# Patient Record
Sex: Male | Born: 1990 | Race: White | Hispanic: No | Marital: Married | Smoking: Never smoker
Health system: Southern US, Community
[De-identification: ages and names within clinical notes are randomized; demographics above are authoritative.]

## PROBLEM LIST (undated history)

## (undated) DIAGNOSIS — S02609A Fracture of mandible, unspecified, initial encounter for closed fracture: Secondary | ICD-10-CM

## (undated) DIAGNOSIS — Z87820 Personal history of traumatic brain injury: Secondary | ICD-10-CM

## (undated) DIAGNOSIS — J45909 Unspecified asthma, uncomplicated: Secondary | ICD-10-CM

## (undated) DIAGNOSIS — J302 Other seasonal allergic rhinitis: Secondary | ICD-10-CM

## (undated) DIAGNOSIS — S129XXA Fracture of neck, unspecified, initial encounter: Secondary | ICD-10-CM

## (undated) HISTORY — PX: FRACTURE SURGERY: SHX138

---

## 2010-08-25 ENCOUNTER — Emergency Department (HOSPITAL_COMMUNITY)
Admission: EM | Admit: 2010-08-25 | Discharge: 2010-08-26 | Payer: Self-pay | Source: Home / Self Care | Admitting: Emergency Medicine

## 2010-09-01 LAB — COMPREHENSIVE METABOLIC PANEL
ALT: 53 U/L (ref 0–53)
AST: 41 U/L — ABNORMAL HIGH (ref 0–37)
Albumin: 4.5 g/dL (ref 3.5–5.2)
Alkaline Phosphatase: 56 U/L (ref 39–117)
BUN: 13 mg/dL (ref 6–23)
CO2: 26 mEq/L (ref 19–32)
Calcium: 9.7 mg/dL (ref 8.4–10.5)
Chloride: 106 mEq/L (ref 96–112)
Creatinine, Ser: 0.94 mg/dL (ref 0.4–1.5)
GFR calc Af Amer: >60 mL/min (ref >60)
GFR calc non Af Amer: >60 mL/min (ref >60)
Glucose, Bld: 87 mg/dL (ref 70–99)
Potassium: 3.9 mEq/L (ref 3.5–5.1)
Sodium: 140 mEq/L (ref 135–145)
Total Bilirubin: 0.9 mg/dL (ref 0.3–1.2)
Total Protein: 7.3 g/dL (ref 6.0–8.3)

## 2010-09-01 LAB — URINALYSIS, ROUTINE W REFLEX MICROSCOPIC
Bilirubin Urine: NEGATIVE
Hgb urine dipstick: NEGATIVE
Ketones, ur: NEGATIVE mg/dL
Nitrite: NEGATIVE
Protein, ur: NEGATIVE mg/dL
Specific Gravity, Urine: 1.01 (ref 1.005–1.030)
Urine Glucose, Fasting: NEGATIVE mg/dL
Urobilinogen, UA: 0.2 mg/dL (ref 0.0–1.0)
pH: 7 (ref 5.0–8.0)

## 2010-09-01 LAB — DIFFERENTIAL
Basophils Absolute: 0 10*3/uL (ref 0.0–0.1)
Basophils Relative: 0 % (ref 0–1)
Eosinophils Absolute: 0.3 10*3/uL (ref 0.0–0.7)
Eosinophils Relative: 3 % (ref 0–5)
Lymphocytes Relative: 18 % (ref 12–46)
Lymphs Abs: 1.5 10*3/uL (ref 0.7–4.0)
Monocytes Absolute: 0.7 10*3/uL (ref 0.1–1.0)
Monocytes Relative: 8 % (ref 3–12)
Neutro Abs: 5.8 10*3/uL (ref 1.7–7.7)
Neutrophils Relative %: 71 % (ref 43–77)

## 2010-09-01 LAB — CBC
HCT: 40.2 % (ref 39.0–52.0)
Hemoglobin: 14.5 g/dL (ref 13.0–17.0)
MCH: 31.2 pg (ref 26.0–34.0)
MCHC: 36.1 g/dL — ABNORMAL HIGH (ref 30.0–36.0)
MCV: 86.5 fL (ref 78.0–100.0)
Platelets: 157 10*3/uL (ref 150–400)
RBC: 4.65 MIL/uL (ref 4.22–5.81)
RDW: 12 % (ref 11.5–15.5)
WBC: 8.2 10*3/uL (ref 4.0–10.5)

## 2010-09-01 LAB — LIPASE, BLOOD: Lipase: 29 U/L (ref 11–59)

## 2010-10-10 ENCOUNTER — Other Ambulatory Visit (HOSPITAL_COMMUNITY): Payer: Self-pay | Admitting: Family Medicine

## 2010-10-10 DIAGNOSIS — N5089 Other specified disorders of the male genital organs: Secondary | ICD-10-CM

## 2010-10-13 ENCOUNTER — Ambulatory Visit (HOSPITAL_COMMUNITY)
Admission: RE | Admit: 2010-10-13 | Discharge: 2010-10-13 | Disposition: A | Discharge status: Home / Self Care | Payer: Medicaid Other | Source: Ambulatory Visit | Attending: Family Medicine | Admitting: Family Medicine

## 2010-10-13 ENCOUNTER — Other Ambulatory Visit (HOSPITAL_COMMUNITY): Payer: Self-pay | Admitting: Family Medicine

## 2010-10-13 DIAGNOSIS — N508 Other specified disorders of male genital organs: Secondary | ICD-10-CM | POA: Insufficient documentation

## 2010-10-13 DIAGNOSIS — N5089 Other specified disorders of the male genital organs: Secondary | ICD-10-CM

## 2010-11-26 ENCOUNTER — Emergency Department (HOSPITAL_COMMUNITY)
Admission: EM | Admit: 2010-11-26 | Discharge: 2010-11-26 | Disposition: A | Discharge status: Home / Self Care | Payer: Medicaid Other | Attending: Emergency Medicine | Admitting: Emergency Medicine

## 2010-11-26 ENCOUNTER — Emergency Department (HOSPITAL_COMMUNITY): Payer: Medicaid Other

## 2010-11-26 DIAGNOSIS — J45909 Unspecified asthma, uncomplicated: Secondary | ICD-10-CM | POA: Insufficient documentation

## 2010-11-26 DIAGNOSIS — J309 Allergic rhinitis, unspecified: Secondary | ICD-10-CM | POA: Insufficient documentation

## 2012-07-08 ENCOUNTER — Emergency Department (HOSPITAL_COMMUNITY): Payer: Medicaid Other

## 2012-07-08 ENCOUNTER — Encounter (HOSPITAL_COMMUNITY): Payer: Self-pay | Admitting: *Deleted

## 2012-07-08 ENCOUNTER — Emergency Department (HOSPITAL_COMMUNITY)
Admission: EM | Admit: 2012-07-08 | Discharge: 2012-07-09 | Disposition: A | Discharge status: Home / Self Care | Payer: Medicaid Other | Attending: Emergency Medicine | Admitting: Emergency Medicine

## 2012-07-08 DIAGNOSIS — Y92009 Unspecified place in unspecified non-institutional (private) residence as the place of occurrence of the external cause: Secondary | ICD-10-CM | POA: Insufficient documentation

## 2012-07-08 DIAGNOSIS — Z23 Encounter for immunization: Secondary | ICD-10-CM | POA: Insufficient documentation

## 2012-07-08 DIAGNOSIS — S61409A Unspecified open wound of unspecified hand, initial encounter: Secondary | ICD-10-CM | POA: Insufficient documentation

## 2012-07-08 DIAGNOSIS — S61459A Open bite of unspecified hand, initial encounter: Secondary | ICD-10-CM

## 2012-07-08 DIAGNOSIS — Y939 Activity, unspecified: Secondary | ICD-10-CM | POA: Insufficient documentation

## 2012-07-08 DIAGNOSIS — W540XXA Bitten by dog, initial encounter: Secondary | ICD-10-CM | POA: Insufficient documentation

## 2012-07-08 MED ORDER — AMOXICILLIN-POT CLAVULANATE 875-125 MG PO TABS
1.0000 | ORAL_TABLET | Freq: Once | ORAL | Status: AC
  Administered 2012-07-08: 1 via ORAL
  Filled 2012-07-08: qty 1

## 2012-07-08 MED ORDER — RABIES IMMUNE GLOBULIN 150 UNIT/ML IM INJ
INJECTION | INTRAMUSCULAR | Status: AC
  Filled 2012-07-08: qty 2

## 2012-07-08 MED ORDER — AMOXICILLIN-POT CLAVULANATE 875-125 MG PO TABS: 1.0000 | ORAL_TABLET | Freq: Two times a day (BID) | ORAL | Status: DC

## 2012-07-08 MED ORDER — RABIES VACCINE, PCEC IM SUSR
1.0000 mL | Freq: Once | INTRAMUSCULAR | Status: AC
  Administered 2012-07-08: 1 mL via INTRAMUSCULAR
  Filled 2012-07-08 (×2): qty 1

## 2012-07-08 MED ORDER — RABIES IMMUNE GLOBULIN 150 UNIT/ML IM INJ
INJECTION | INTRAMUSCULAR | Status: AC
  Filled 2012-07-08: qty 12

## 2012-07-08 MED ORDER — NAPROXEN 500 MG PO TABS: 500.0000 mg | ORAL_TABLET | Freq: Two times a day (BID) | ORAL | Status: DC

## 2012-07-08 MED ORDER — RABIES IMMUNE GLOBULIN 150 UNIT/ML IM INJ
20.0000 [IU]/kg | INJECTION | Freq: Once | INTRAMUSCULAR | Status: AC
  Administered 2012-07-08: 2100 [IU] via INTRAMUSCULAR
  Filled 2012-07-08: qty 14

## 2012-07-08 NOTE — ED Notes (Signed)
[facility] animal control called back and spoke with patient.

## 2012-07-08 NOTE — ED Provider Notes (Signed)
History     CSN: [id number]  Arrival date & time 07/08/12  1949   First MD Initiated Contact with Patient 07/08/12 2050      Chief Complaint  Patient presents with  . Animal Bite    (Consider location/radiation/quality/duration/timing/severity/associated sxs/prior treatment) The history is provided by the patient and the spouse.  21 year old male status post dog bite to left hand around the thumb and thenar eminence. That occurred yesterday. The dog has gotten away and his whereabouts is unknown. [employer] and to a friend of his. Immunizations not verified. No other injuries.  Patient states there is swelling to the hand in some discomfort but it's minimal maybe 4/10 or 3/10.  History reviewed. No pertinent past medical history.  History reviewed. No pertinent past surgical history.  History reviewed. No pertinent family history.  History  Substance Use Topics  . Smoking status: Never Smoker   . Smokeless tobacco: Not on file  . Alcohol Use: No      Review of Systems  Constitutional: Negative for fever.  HENT: Negative for neck pain.   Eyes: Negative for redness.  Respiratory: Negative for shortness of breath.   Cardiovascular: Negative for chest pain.  Gastrointestinal: Negative for abdominal pain.  Genitourinary: Negative for hematuria.  Musculoskeletal: Negative for back pain.  Skin: Positive for wound. Negative for rash.  Neurological: Negative for weakness, numbness and headaches.  Hematological: Does not bruise/bleed easily.    Allergies  Review of patient's allergies indicates no known allergies.  Home Medications   Current Outpatient Rx  Name  Route  Sig  Dispense  Refill  . IBUPROFEN 200 MG PO TABS   Oral   Take 600 mg by mouth 2 (two) times daily as needed. For pain         . AMOXICILLIN-POT CLAVULANATE 875-125 MG PO TABS   Oral   Take 1 tablet by mouth every 12 (twelve) hours.   14 tablet   0   . NAPROXEN 500 MG PO TABS   Oral   Take  1 tablet (500 mg total) by mouth 2 (two) times daily.   14 tablet   0     BP 112/66  Pulse 79  Temp 98.7 F (37.1 C) (Oral)  Resp 20  Ht 6' (1.829 m)  Wt 233 lb (105.688 kg)  BMI 31.60 kg/m2  SpO2 98%  Physical Exam  Nursing note and vitals reviewed. Constitutional: He is oriented to person, place, and time. He appears well-developed and well-nourished. No distress.  HENT:  Head: Normocephalic and atraumatic.  Mouth/Throat: Oropharynx is clear and moist.  Eyes: Conjunctivae normal and EOM are normal. Pupils are equal, round, and reactive to light.  Neck: Normal range of motion. Neck supple.  Cardiovascular: Normal rate, regular rhythm, normal heart sounds and intact distal pulses.   No murmur heard. Pulmonary/Chest: Effort normal and breath sounds normal.  Abdominal: Soft. Bowel sounds are normal. There is no tenderness.  Musculoskeletal: Normal range of motion. He exhibits edema and tenderness.        With anterior and posterior puncture wounds around the thenar eminence in the thumb itself. Some swelling to the thenar eminence good range of motion of thumb and index finger no sniffing and swelling of the index finger slight swelling of the thumb. Cap refill less than 2 seconds sensation intact. No evidence of deep infection. No erythema. No skin tears or lacerations requiring repair. Just puncture wounds.  Neurological: He is alert and oriented to person,  place, and time. No cranial nerve deficit. He exhibits normal muscle tone. Coordination normal.  Skin: Skin is warm. No rash noted.    ED Course  Procedures (including critical care time)  Labs Reviewed - No data to display Dg Hand Complete Left  07/08/2012  *RADIOLOGY REPORT*  Clinical Data: Puncture wound as post body to.  LEFT HAND - COMPLETE 3+ VIEW  Comparison: None.  Findings: No radiodense foreign body.  No subcutaneous gas is evident. Negative for fracture, dislocation, or other acute abnormality.  Normal alignment  and mineralization. No significant degenerative change.  Regional soft tissues unremarkable.  IMPRESSION:  Negative   Original Report Authenticated By: D. Andria Rhein, MD      1. Animal bite of hand       MDM  Patient status post bite from a dog to his left hand in the thenar eminence thumb area yesterday. Daughter's whereabouts is unknown. Dogs immunization records are unknown previous owner thought that the dog had his rabies vaccines. As stated the dog is missing. X-rays of the left hand show no bony injury. No evidence of frank infection today. We'll treat with Augmentin to help prevent infection. Rabies immunoglobulin and rabies vaccination protocol started and initiated today.        Shelda Jakes, MD 07/08/12 [phone]

## 2012-07-08 NOTE — ED Notes (Addendum)
Dog bite to lt hand yesterday. Does not  Know about vaccinations , had gotten the dog from someone else for hunting, Does not know where the dog is .  Bite occurred at pts home in [facility]. And dog ran off.  Pt has not talked with animal control

## 2012-07-08 NOTE — ED Notes (Signed)
[facility] contacted about dog bite and advised they would have animal control contact the ED.

## 2012-07-11 ENCOUNTER — Encounter (HOSPITAL_COMMUNITY): Payer: Medicaid Other | Attending: Emergency Medicine

## 2012-07-11 ENCOUNTER — Emergency Department (HOSPITAL_COMMUNITY)
Admission: EM | Admit: 2012-07-11 | Discharge: 2012-07-11 | Discharge status: Against Medical Advice | Payer: Medicaid Other

## 2012-07-11 DIAGNOSIS — T148XXA Other injury of unspecified body region, initial encounter: Secondary | ICD-10-CM | POA: Insufficient documentation

## 2012-07-11 DIAGNOSIS — Z23 Encounter for immunization: Secondary | ICD-10-CM | POA: Insufficient documentation

## 2012-07-11 DIAGNOSIS — W540XXA Bitten by dog, initial encounter: Secondary | ICD-10-CM | POA: Insufficient documentation

## 2012-07-11 NOTE — ED Notes (Signed)
Day 3 Rabies HDCV given 0810 right deltoid. Lot # [id number], Exp 04/26/13. Paperwork faxed to specialty clinic and pharmacy.

## 2012-07-15 ENCOUNTER — Encounter (HOSPITAL_COMMUNITY): Payer: Medicaid Other

## 2012-07-15 DIAGNOSIS — W540XXA Bitten by dog, initial encounter: Secondary | ICD-10-CM

## 2012-07-15 MED ORDER — RABIES VACCINE, PCEC IM SUSR
1.0000 mL | Freq: Once | INTRAMUSCULAR | Status: AC
  Administered 2012-07-15: 1 mL via INTRAMUSCULAR
  Filled 2012-07-15: qty 1

## 2012-07-15 NOTE — Progress Notes (Signed)
Patrick Sutton presents today for injection per MD orders. Rabies vaccine administered SQ in right Upper Arm. Administration without incident. Patient tolerated well.

## 2012-07-22 ENCOUNTER — Encounter (HOSPITAL_COMMUNITY): Payer: Medicaid Other | Attending: Emergency Medicine

## 2012-07-22 VITALS — BP 119/80 | HR 78 | Temp 97.8°F | Resp 20

## 2012-07-22 DIAGNOSIS — W540XXA Bitten by dog, initial encounter: Secondary | ICD-10-CM | POA: Insufficient documentation

## 2012-07-22 DIAGNOSIS — T148XXA Other injury of unspecified body region, initial encounter: Secondary | ICD-10-CM | POA: Insufficient documentation

## 2012-07-22 MED ORDER — RABIES VACCINE, PCEC IM SUSR
1.0000 mL | Freq: Once | INTRAMUSCULAR | Status: AC
  Administered 2012-07-22: 1 mL via INTRAMUSCULAR

## 2012-07-22 NOTE — Progress Notes (Signed)
Patrick Sutton presents today for injection per MD orders. Rabies vaccine 1 ml administered im in right Upper Arm. Administration without incident. Patient tolerated well.

## 2013-09-04 ENCOUNTER — Emergency Department (HOSPITAL_COMMUNITY)
Admission: EM | Admit: 2013-09-04 | Discharge: 2013-09-04 | Disposition: A | Discharge status: Home / Self Care | Payer: Medicaid Other | Attending: Emergency Medicine | Admitting: Emergency Medicine

## 2013-09-04 ENCOUNTER — Emergency Department (HOSPITAL_COMMUNITY): Payer: Medicaid Other

## 2013-09-04 ENCOUNTER — Encounter (HOSPITAL_COMMUNITY): Payer: Self-pay | Admitting: Emergency Medicine

## 2013-09-04 DIAGNOSIS — Z791 Long term (current) use of non-steroidal anti-inflammatories (NSAID): Secondary | ICD-10-CM | POA: Insufficient documentation

## 2013-09-04 DIAGNOSIS — X58XXXA Exposure to other specified factors, initial encounter: Secondary | ICD-10-CM | POA: Insufficient documentation

## 2013-09-04 DIAGNOSIS — Y929 Unspecified place or not applicable: Secondary | ICD-10-CM | POA: Insufficient documentation

## 2013-09-04 DIAGNOSIS — G8929 Other chronic pain: Secondary | ICD-10-CM | POA: Insufficient documentation

## 2013-09-04 DIAGNOSIS — S93402A Sprain of unspecified ligament of left ankle, initial encounter: Secondary | ICD-10-CM

## 2013-09-04 DIAGNOSIS — Y939 Activity, unspecified: Secondary | ICD-10-CM | POA: Insufficient documentation

## 2013-09-04 DIAGNOSIS — S93409A Sprain of unspecified ligament of unspecified ankle, initial encounter: Secondary | ICD-10-CM | POA: Insufficient documentation

## 2013-09-04 DIAGNOSIS — Z792 Long term (current) use of antibiotics: Secondary | ICD-10-CM | POA: Insufficient documentation

## 2013-09-04 MED ORDER — CELECOXIB 100 MG PO CAPS: 100.0000 mg | ORAL_CAPSULE | Freq: Two times a day (BID) | ORAL | Status: DC

## 2013-09-04 NOTE — ED Notes (Signed)
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

## 2013-09-04 NOTE — ED Provider Notes (Signed)
Medical screening examination/treatment/procedure(s) were performed by non-physician practitioner and as supervising physician I was immediately available for consultation/collaboration.  EKG Interpretation   None         Zniya Cottone L Mairany Bruno, MD 09/04/13 2332 

## 2013-09-04 NOTE — ED Notes (Signed)
Pain lt ankle  For 1 week, good dp pulse and distal sensation.  ASO applied.

## 2013-09-04 NOTE — Discharge Instructions (Signed)
Your x-ray is negative for fracture or dislocation. Your examination is consistent with possible ankle sprain. There is some concern of possible week tendon of the lateral portion of your left foot. Please use the ankle stirrup splint over the next 7-10 days. Please use Celebrex 2 times daily with food. Please see the orthopedic physician listed above for additional management if not improving. Ankle Sprain An ankle sprain is an injury to the strong, fibrous tissues (ligaments) that hold your ankle bones together.  HOME CARE   Put ice on your ankle for 1 2 days or as told by your doctor.  Put ice in a plastic bag.  Place a towel between your skin and the bag.  Leave the ice on for 15-20 minutes at a time, every 2 hours while you are awake.  Only take medicine as told by your doctor.  Raise (elevate) your injured ankle above the level of your heart as much as possible for 2 3 days.  Use crutches if your doctor tells you to. Slowly put your own weight on the affected ankle. Use the crutches until you can walk without pain.  If you have a plaster splint:  Do not rest it on anything harder than a pillow for 24 hours.  Do not put weight on it.  Do not get it wet.  Take it off to shower or bathe.  If given, use an elastic wrap or support stocking for support. Take the wrap off if your toes lose feeling (numb), tingle, or turn cold or blue.  If you have an air splint:  Add or let out air to make it comfortable.  Take it off at night and to shower and bathe.  Wiggle your toes and move your ankle up and down often while you are wearing it. GET HELP RIGHT AWAY IF:   Your toes lose feeling (numb) or turn blue.  You have severe pain that is increasing.  You have rapidly increasing bruising or puffiness (swelling).  Your toes feel very cold.  You lose feeling in your foot.  Your medicine does not help your pain. MAKE SURE YOU:   Understand these instructions.  Will watch  your condition.  Will get help right away if you are not doing well or get worse. Document Released: 01/20/2008 Document Revised: 04/27/2012 Document Reviewed: 02/15/2012 [facility] Patient Information 2014 [employer], [place].

## 2013-09-04 NOTE — ED Provider Notes (Signed)
CSN: [id number]     Arrival date & time 09/04/13  1521 History   First MD Initiated Contact with Patient 09/04/13 1859     Chief Complaint  Patient presents with  . Ankle Pain   (Consider location/radiation/quality/duration/timing/severity/associated sxs/prior Treatment) Patient is a 23 y.o. male presenting with ankle pain. The history is provided by the patient.  Ankle Pain Location:  Ankle Time since incident:  1 week Ankle location:  L ankle Pain details:    Quality:  Aching   Radiates to:  Does not radiate   Severity:  Moderate   Duration:  1 week   Timing:  Intermittent   Progression:  Worsening Chronicity: acute on chronic. Dislocation: no   Prior injury to area:  Yes Relieved by:  Nothing Worsened by:  Bearing weight Ineffective treatments:  None tried Risk factors: no frequent fractures     Past Medical History  Diagnosis Date  . Chronic ankle pain    History reviewed. No pertinent past surgical history. No family history on file. History  Substance Use Topics  . Smoking status: Never Smoker   . Smokeless tobacco: Not on file  . Alcohol Use: No    Review of Systems  Allergies  Review of patient's allergies indicates no known allergies.  Home Medications   Current Outpatient Rx  Name  Route  Sig  Dispense  Refill  . amoxicillin-clavulanate (AUGMENTIN) 875-125 MG per tablet   Oral   Take 1 tablet by mouth every 12 (twelve) hours.   14 tablet   0   . ibuprofen (ADVIL,MOTRIN) 200 MG tablet   Oral   Take 600 mg by mouth 2 (two) times daily as needed. For pain         . naproxen (NAPROSYN) 500 MG tablet   Oral   Take 1 tablet (500 mg total) by mouth 2 (two) times daily.   14 tablet   0    BP 124/63  Pulse 79  Temp(Src) 98.4 F (36.9 C) (Oral)  Resp 24  Ht 6\' 1"  (1.854 m)  Wt 250 lb (113.399 kg)  BMI 32.99 kg/m2  SpO2 99% Physical Exam  Nursing note and vitals reviewed. Constitutional: He is oriented to person, place, and time. He  appears well-developed and well-nourished.  Non-toxic appearance.  HENT:  Head: Normocephalic.  Right Ear: Tympanic membrane and external ear normal.  Left Ear: Tympanic membrane and external ear normal.  Eyes: EOM and lids are normal. Pupils are equal, round, and reactive to light.  Neck: Normal range of motion. Neck supple. Carotid bruit is not present.  Cardiovascular: Normal rate, regular rhythm, normal heart sounds, intact distal pulses and normal pulses.   Pulmonary/Chest: Breath sounds normal. No respiratory distress.  Abdominal: Soft. Bowel sounds are normal. There is no tenderness. There is no guarding.  Musculoskeletal: Normal range of motion.  There is pain and mild swelling of the lateral malleolus. The Achilles tendon is intact on the left ankle. The dorsalis pedis pulse is 2+ on the left. The capillary refill is less than 2 seconds on the left.  Lymphadenopathy:       Head (right side): No submandibular adenopathy present.       Head (left side): No submandibular adenopathy present.    He has no cervical adenopathy.  Neurological: He is alert and oriented to person, place, and time. He has normal strength. No cranial nerve deficit or sensory deficit.  Skin: Skin is warm and dry.  Psychiatric: He has a  normal mood and affect. His speech is normal.    ED Course  Procedures (including critical care time) Labs Review Labs Reviewed - No data to display Imaging Review Dg Ankle Complete Left  09/04/2013   CLINICAL DATA:  Injury  EXAM: LEFT ANKLE COMPLETE - 3+ VIEW  COMPARISON:  None.  FINDINGS: There is no evidence of fracture, dislocation, or joint effusion. Ankle mortise is approximated. There is no evidence of arthropathy or other focal bone abnormality. Mild soft tissue swelling is present.  IMPRESSION: Mild diffuse soft tissue swelling. No acute fracture or dislocation.   Electronically Signed   By: Rise MuBenjamin  McClintock M.D.   On: 09/04/2013 16:19    EKG Interpretation    None       MDM  No diagnosis found. *I have reviewed nursing notes, vital signs, and all appropriate lab and imaging results for this patient.**  X-ray of the left ankle is negative for fracture or dislocation. There is mild diffuse soft tissue swelling present. Suspect that the patient has an ankle sprain of what was probably already week ankle from the injury 3 years ago. The plan at this time is for the patient being fitted with an ankle stirrup splint. He will be advised to use celebrex 2 times daily with food. The patient will be referred to orthopedics for additional evaluation if not improving.  Kathie DikeHobson M Amadeo Coke, PA-C 09/04/13 1920  Kathie DikeHobson M Leon Goodnow, PA-C 09/04/13 1925

## 2013-09-05 ENCOUNTER — Telehealth (HOSPITAL_COMMUNITY): Payer: Self-pay

## 2013-09-05 NOTE — ED Notes (Signed)
Call from pharmacy pt Rx for Celebrex not covered by Medicaid.  Chart to [facility] for review.

## 2013-09-06 ENCOUNTER — Telehealth (HOSPITAL_COMMUNITY): Payer: Self-pay

## 2013-09-06 NOTE — ED Notes (Signed)
Chart reviewed by Dr Wilkie AyeHorton "None, take home ibuprofen and naproxen as noted in chart"  Called pharmacy and informed .  Per pharmacist they spoke to prescribing provider and he changed Rx to Naproxen 500 mg tab.

## 2014-12-07 ENCOUNTER — Emergency Department (HOSPITAL_COMMUNITY): Payer: Medicaid Other

## 2014-12-07 ENCOUNTER — Encounter (HOSPITAL_COMMUNITY): Payer: Self-pay | Admitting: Emergency Medicine

## 2014-12-07 ENCOUNTER — Inpatient Hospital Stay (HOSPITAL_COMMUNITY)
Admission: EM | Admit: 2014-12-07 | Discharge: 2014-12-11 | DRG: 131 | Disposition: A | Discharge status: Home Health | Payer: Medicaid Other | Attending: General Surgery | Admitting: General Surgery

## 2014-12-07 DIAGNOSIS — S27329A Contusion of lung, unspecified, initial encounter: Secondary | ICD-10-CM

## 2014-12-07 DIAGNOSIS — S2239XA Fracture of one rib, unspecified side, initial encounter for closed fracture: Secondary | ICD-10-CM

## 2014-12-07 DIAGNOSIS — S0265XA Fracture of angle of mandible, initial encounter for closed fracture: Principal | ICD-10-CM | POA: Diagnosis present

## 2014-12-07 DIAGNOSIS — S02600A Fracture of unspecified part of body of mandible, initial encounter for closed fracture: Secondary | ICD-10-CM | POA: Diagnosis not present

## 2014-12-07 DIAGNOSIS — S8011XA Contusion of right lower leg, initial encounter: Secondary | ICD-10-CM

## 2014-12-07 DIAGNOSIS — S129XXA Fracture of neck, unspecified, initial encounter: Secondary | ICD-10-CM

## 2014-12-07 DIAGNOSIS — S0081XA Abrasion of other part of head, initial encounter: Secondary | ICD-10-CM

## 2014-12-07 DIAGNOSIS — S02609A Fracture of mandible, unspecified, initial encounter for closed fracture: Secondary | ICD-10-CM

## 2014-12-07 DIAGNOSIS — S060X9A Concussion with loss of consciousness of unspecified duration, initial encounter: Secondary | ICD-10-CM | POA: Diagnosis present

## 2014-12-07 DIAGNOSIS — S02401A Maxillary fracture, unspecified, initial encounter for closed fracture: Secondary | ICD-10-CM | POA: Diagnosis present

## 2014-12-07 DIAGNOSIS — S12391A Other nondisplaced fracture of fourth cervical vertebra, initial encounter for closed fracture: Secondary | ICD-10-CM | POA: Diagnosis present

## 2014-12-07 DIAGNOSIS — S27322A Contusion of lung, bilateral, initial encounter: Secondary | ICD-10-CM | POA: Diagnosis present

## 2014-12-07 DIAGNOSIS — S0101XA Laceration without foreign body of scalp, initial encounter: Secondary | ICD-10-CM | POA: Diagnosis present

## 2014-12-07 DIAGNOSIS — M25562 Pain in left knee: Secondary | ICD-10-CM

## 2014-12-07 DIAGNOSIS — Y92410 Unspecified street and highway as the place of occurrence of the external cause: Secondary | ICD-10-CM | POA: Diagnosis not present

## 2014-12-07 DIAGNOSIS — S12290A Other displaced fracture of third cervical vertebra, initial encounter for closed fracture: Secondary | ICD-10-CM

## 2014-12-07 DIAGNOSIS — S12200A Unspecified displaced fracture of third cervical vertebra, initial encounter for closed fracture: Secondary | ICD-10-CM | POA: Diagnosis present

## 2014-12-07 DIAGNOSIS — S12291A Other nondisplaced fracture of third cervical vertebra, initial encounter for closed fracture: Secondary | ICD-10-CM | POA: Diagnosis present

## 2014-12-07 DIAGNOSIS — S21301A Unspecified open wound of right front wall of thorax with penetration into thoracic cavity, initial encounter: Secondary | ICD-10-CM

## 2014-12-07 DIAGNOSIS — S060XAA Concussion with loss of consciousness status unknown, initial encounter: Secondary | ICD-10-CM | POA: Diagnosis present

## 2014-12-07 DIAGNOSIS — S2232XA Fracture of one rib, left side, initial encounter for closed fracture: Secondary | ICD-10-CM | POA: Diagnosis not present

## 2014-12-07 DIAGNOSIS — T1490XA Injury, unspecified, initial encounter: Secondary | ICD-10-CM

## 2014-12-07 DIAGNOSIS — Z87820 Personal history of traumatic brain injury: Secondary | ICD-10-CM

## 2014-12-07 DIAGNOSIS — S80811A Abrasion, right lower leg, initial encounter: Secondary | ICD-10-CM

## 2014-12-07 DIAGNOSIS — S0990XA Unspecified injury of head, initial encounter: Secondary | ICD-10-CM

## 2014-12-07 DIAGNOSIS — S12300A Unspecified displaced fracture of fourth cervical vertebra, initial encounter for closed fracture: Secondary | ICD-10-CM | POA: Diagnosis present

## 2014-12-07 DIAGNOSIS — M542 Cervicalgia: Secondary | ICD-10-CM

## 2014-12-07 DIAGNOSIS — S0993XA Unspecified injury of face, initial encounter: Secondary | ICD-10-CM | POA: Diagnosis present

## 2014-12-07 HISTORY — DX: Personal history of traumatic brain injury: Z87.820

## 2014-12-07 HISTORY — DX: Fracture of neck, unspecified, initial encounter: S12.9XXA

## 2014-12-07 HISTORY — DX: Unspecified asthma, uncomplicated: J45.909

## 2014-12-07 HISTORY — DX: Fracture of mandible, unspecified, initial encounter for closed fracture: S02.609A

## 2014-12-07 LAB — CBC
HCT: 45.2 % (ref 39.0–52.0)
Hemoglobin: 15.1 g/dL (ref 13.0–17.0)
MCH: 30 pg (ref 26.0–34.0)
MCHC: 33.4 g/dL (ref 30.0–36.0)
MCV: 89.7 fL (ref 78.0–100.0)
PLATELETS: 183 10*3/uL (ref 150–400)
RBC: 5.04 MIL/uL (ref 4.22–5.81)
RDW: 12 % (ref 11.5–15.5)
WBC: 10.8 10*3/uL — AB (ref 4.0–10.5)

## 2014-12-07 LAB — COMPREHENSIVE METABOLIC PANEL
ALBUMIN: 4.5 g/dL (ref 3.5–5.2)
ALK PHOS: 52 U/L (ref 39–117)
ALT: 89 U/L — ABNORMAL HIGH (ref 0–53)
ANION GAP: 8 (ref 5–15)
AST: 66 U/L — AB (ref 0–37)
BUN: 15 mg/dL (ref 6–23)
CALCIUM: 9 mg/dL (ref 8.4–10.5)
CO2: 26 mmol/L (ref 19–32)
Chloride: 102 mmol/L (ref 96–112)
Creatinine, Ser: 1 mg/dL (ref 0.50–1.35)
GFR calc Af Amer: >90 mL/min (ref >90)
Glucose, Bld: 122 mg/dL — ABNORMAL HIGH (ref 70–99)
Potassium: 3.3 mmol/L — ABNORMAL LOW (ref 3.5–5.1)
SODIUM: 136 mmol/L (ref 135–145)
TOTAL PROTEIN: 7.1 g/dL (ref 6.0–8.3)
Total Bilirubin: 1.2 mg/dL (ref 0.3–1.2)

## 2014-12-07 LAB — ETHANOL: ALCOHOL ETHYL (B): 5 mg/dL (ref 0–9)

## 2014-12-07 LAB — MRSA PCR SCREENING: MRSA by PCR: NEGATIVE

## 2014-12-07 MED ORDER — PANTOPRAZOLE SODIUM 40 MG PO TBEC: 40.0000 mg | DELAYED_RELEASE_TABLET | Freq: Every day | ORAL | Status: DC

## 2014-12-07 MED ORDER — POTASSIUM CHLORIDE IN NACL 20-0.45 MEQ/L-% IV SOLN
INTRAVENOUS | Status: DC
  Administered 2014-12-07 – 2014-12-10 (×4): via INTRAVENOUS
  Filled 2014-12-07 (×6): qty 1000

## 2014-12-07 MED ORDER — ONDANSETRON HCL 4 MG PO TABS: 4.0000 mg | ORAL_TABLET | Freq: Four times a day (QID) | ORAL | Status: DC | PRN

## 2014-12-07 MED ORDER — ONDANSETRON HCL 4 MG/2ML IJ SOLN: 4.0000 mg | Freq: Four times a day (QID) | INTRAMUSCULAR | Status: DC | PRN

## 2014-12-07 MED ORDER — CHLORHEXIDINE GLUCONATE 0.12 % MT SOLN
15.0000 mL | Freq: Two times a day (BID) | OROMUCOSAL | Status: DC
  Administered 2014-12-07 – 2014-12-11 (×8): 15 mL via OROMUCOSAL
  Filled 2014-12-07 (×10): qty 15

## 2014-12-07 MED ORDER — MORPHINE SULFATE 2 MG/ML IJ SOLN
2.0000 mg | INTRAMUSCULAR | Status: DC | PRN
  Administered 2014-12-07 – 2014-12-10 (×7): 2 mg via INTRAVENOUS
  Filled 2014-12-07 (×9): qty 1

## 2014-12-07 MED ORDER — BACITRACIN ZINC 500 UNIT/GM EX OINT
TOPICAL_OINTMENT | Freq: Two times a day (BID) | CUTANEOUS | Status: DC
  Administered 2014-12-07: 17:00:00 via TOPICAL
  Administered 2014-12-08: 1 via TOPICAL
  Administered 2014-12-08 – 2014-12-09 (×2): via TOPICAL
  Administered 2014-12-09: 1 via TOPICAL
  Administered 2014-12-10 (×2): via TOPICAL
  Administered 2014-12-11: 15.5556 via TOPICAL
  Filled 2014-12-07 (×2): qty 28.35

## 2014-12-07 MED ORDER — PANTOPRAZOLE SODIUM 40 MG IV SOLR
40.0000 mg | Freq: Every day | INTRAVENOUS | Status: DC
  Administered 2014-12-07 – 2014-12-10 (×2): 40 mg via INTRAVENOUS
  Filled 2014-12-07 (×4): qty 40

## 2014-12-07 MED ORDER — TETANUS-DIPHTH-ACELL PERTUSSIS 5-2.5-18.5 LF-MCG/0.5 IM SUSP
0.5000 mL | Freq: Once | INTRAMUSCULAR | Status: AC
  Administered 2014-12-07: 0.5 mL via INTRAMUSCULAR
  Filled 2014-12-07: qty 0.5

## 2014-12-07 MED ORDER — SODIUM CHLORIDE 0.9 % IV SOLN
1000.0000 mL | Freq: Once | INTRAVENOUS | Status: AC
  Administered 2014-12-07: 1000 mL via INTRAVENOUS

## 2014-12-07 MED ORDER — IOHEXOL 350 MG/ML SOLN
100.0000 mL | Freq: Once | INTRAVENOUS | Status: AC | PRN
  Administered 2014-12-07: 100 mL via INTRAVENOUS

## 2014-12-07 MED ORDER — FENTANYL CITRATE (PF) 100 MCG/2ML IJ SOLN
50.0000 ug | Freq: Once | INTRAMUSCULAR | Status: AC
  Administered 2014-12-07: 50 ug via INTRAVENOUS
  Filled 2014-12-07: qty 2

## 2014-12-07 MED ORDER — SILVER NITRATE-POT NITRATE 75-25 % EX MISC
1.0000 | Freq: Once | CUTANEOUS | Status: DC
  Filled 2014-12-07: qty 1

## 2014-12-07 MED ORDER — DOCUSATE SODIUM 100 MG PO CAPS
100.0000 mg | ORAL_CAPSULE | Freq: Two times a day (BID) | ORAL | Status: DC
  Filled 2014-12-07: qty 1

## 2014-12-07 MED ORDER — OXYMETAZOLINE HCL 0.05 % NA SOLN
1.0000 | Freq: Once | NASAL | Status: DC
  Filled 2014-12-07: qty 15

## 2014-12-07 MED ORDER — ENOXAPARIN SODIUM 30 MG/0.3ML ~~LOC~~ SOLN
30.0000 mg | Freq: Two times a day (BID) | SUBCUTANEOUS | Status: DC
  Administered 2014-12-07 – 2014-12-11 (×7): 30 mg via SUBCUTANEOUS
  Filled 2014-12-07 (×10): qty 0.3

## 2014-12-07 MED ORDER — HYDROMORPHONE HCL 1 MG/ML IJ SOLN
1.0000 mg | Freq: Once | INTRAMUSCULAR | Status: AC
  Administered 2014-12-07: 1 mg via INTRAVENOUS
  Filled 2014-12-07: qty 1

## 2014-12-07 MED ORDER — CETYLPYRIDINIUM CHLORIDE 0.05 % MT LIQD
7.0000 mL | Freq: Two times a day (BID) | OROMUCOSAL | Status: DC
  Administered 2014-12-07 – 2014-12-09 (×4): 7 mL via OROMUCOSAL

## 2014-12-07 MED ORDER — SODIUM CHLORIDE 0.9 % IV SOLN
1000.0000 mL | INTRAVENOUS | Status: DC
  Administered 2014-12-07: 1000 mL via INTRAVENOUS

## 2014-12-07 MED ORDER — IOHEXOL 300 MG/ML  SOLN: 125.0000 mL | Freq: Once | INTRAMUSCULAR | Status: AC | PRN

## 2014-12-07 MED ORDER — SODIUM CHLORIDE 0.9 % IJ SOLN
INTRAMUSCULAR | Status: AC
  Filled 2014-12-07: qty 500

## 2014-12-07 MED ORDER — POLYETHYLENE GLYCOL 3350 17 G PO PACK
17.0000 g | PACK | Freq: Every day | ORAL | Status: DC
  Administered 2014-12-09 – 2014-12-10 (×2): 17 g via ORAL
  Filled 2014-12-07 (×4): qty 1

## 2014-12-07 MED ORDER — OXYCODONE HCL 5 MG PO TABS: 5.0000 mg | ORAL_TABLET | ORAL | Status: DC | PRN

## 2014-12-07 MED ORDER — SODIUM CHLORIDE 0.9 % IJ SOLN
INTRAMUSCULAR | Status: AC
  Filled 2014-12-07: qty 30

## 2014-12-07 MED ORDER — LIDOCAINE-EPINEPHRINE (PF) 2 %-1:200000 IJ SOLN
10.0000 mL | Freq: Once | INTRAMUSCULAR | Status: AC
  Administered 2014-12-07: 10 mL
  Filled 2014-12-07: qty 20
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
## 2014-12-07 NOTE — ED Notes (Signed)
Patrick Sutton / Bertram DenverVickey Tammey Deeg in/out cath
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
## 2014-12-07 NOTE — ED Notes (Signed)
Patient left ED at this time with [facility]. 

## 2014-12-07 NOTE — ED Notes (Signed)
Dr Lindie SpruceWyatt (trauma) aware that pt has arrived.

## 2014-12-07 NOTE — Procedures (Signed)
Procedure: Simple repair scalp laceration 11cm  Indication: Scalp laceration  Surgeon: Charma IgoMichael Leona Alen, PA-C  Assist: None  Anesthesia: 4ml 1% lidocaine w/epi  EBL: Minimal  Complications: None  Findings: Procedure, risks and benefits were reviewed with patient and family who wanted to proceed. Laceration anesthetized with 1% lidocaine w/epi with good results. Scrubbed with betadine then irrigated with NS. Closed laceration with staples. Patient tolerated procedure well.    Freeman CaldronMichael J. Samiyah Stupka, PA-C Pager: [phone] General Trauma PA Pager: [phone]
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
## 2014-12-07 NOTE — ED Notes (Signed)
Report given to Nehemiah MassedLuke Harman, RN for [facility]

## 2014-12-07 NOTE — ED Notes (Addendum)
MD Wyatt at the bedside assessing patient.

## 2014-12-07 NOTE — ED Notes (Signed)
Changed c- collar to [employer].
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
## 2014-12-07 NOTE — Progress Notes (Signed)
UR completed.  Kiwan Gadsden, RN BSN [facility] CCM Trauma/Neuro ICU Case Manager [phone]  
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
## 2014-12-07 NOTE — ED Notes (Signed)
Afrin and silver nitrate applicator at bedside.
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
## 2014-12-07 NOTE — ED Notes (Signed)
[facility] at bedside for transport. Appropriate paperwork given to crew along with verbal report.
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
## 2014-12-07 NOTE — ED Notes (Signed)
PA at bedside suturing head wound.

## 2014-12-07 NOTE — ED Notes (Signed)
Aspen collar applied per MD request. Using spinal precautions, Dorris Fetchaphyne Anderson, RN, held cervical spine while Lake Bellsiffany Deundra Furber, RN removed old collar and placed aspen collar. Patient c/o right jaw pain when applied, collar adjusted to relieve some pressure.

## 2014-12-08 ENCOUNTER — Inpatient Hospital Stay (HOSPITAL_COMMUNITY): Payer: Medicaid Other | Admitting: Certified Registered Nurse Anesthetist

## 2014-12-08 ENCOUNTER — Encounter (HOSPITAL_COMMUNITY): Admission: EM | Disposition: A | Discharge status: Home Health | Payer: Self-pay | Source: Home / Self Care

## 2014-12-08 ENCOUNTER — Inpatient Hospital Stay (HOSPITAL_COMMUNITY): Payer: Medicaid Other

## 2014-12-08 HISTORY — PX: ORIF MANDIBULAR FRACTURE: SHX2127

## 2014-12-08 LAB — BASIC METABOLIC PANEL
Anion gap: 11 (ref 5–15)
BUN: 10 mg/dL (ref 6–23)
CO2: 22 mmol/L (ref 19–32)
Calcium: 9.1 mg/dL (ref 8.4–10.5)
Chloride: 107 mmol/L (ref 96–112)
Creatinine, Ser: 0.88 mg/dL (ref 0.50–1.35)
GFR calc Af Amer: >90 mL/min (ref >90)
GLUCOSE: 118 mg/dL — AB (ref 70–99)
Potassium: 4 mmol/L (ref 3.5–5.1)
SODIUM: 140 mmol/L (ref 135–145)

## 2014-12-08 LAB — CBC
HCT: 46.5 % (ref 39.0–52.0)
Hemoglobin: 15.6 g/dL (ref 13.0–17.0)
MCH: 29.9 pg (ref 26.0–34.0)
MCHC: 33.5 g/dL (ref 30.0–36.0)
MCV: 89.1 fL (ref 78.0–100.0)
PLATELETS: 173 10*3/uL (ref 150–400)
RBC: 5.22 MIL/uL (ref 4.22–5.81)
RDW: 12.5 % (ref 11.5–15.5)
WBC: 14.5 10*3/uL — ABNORMAL HIGH (ref 4.0–10.5)

## 2014-12-08 SURGERY — OPEN REDUCTION INTERNAL FIXATION (ORIF) MANDIBULAR FRACTURE
Anesthesia: General | Site: Mouth

## 2014-12-08 MED ORDER — LIDOCAINE HCL (CARDIAC) 20 MG/ML IV SOLN
INTRAVENOUS | Status: DC | PRN
  Administered 2014-12-08: 60 mg via INTRAVENOUS

## 2014-12-08 MED ORDER — ONDANSETRON HCL 4 MG/2ML IJ SOLN
INTRAMUSCULAR | Status: AC
  Filled 2014-12-08: qty 2

## 2014-12-08 MED ORDER — LACTATED RINGERS IV SOLN
INTRAVENOUS | Status: DC | PRN
  Administered 2014-12-08 (×2): via INTRAVENOUS

## 2014-12-08 MED ORDER — MEPERIDINE HCL 25 MG/ML IJ SOLN: 6.2500 mg | INTRAMUSCULAR | Status: DC | PRN

## 2014-12-08 MED ORDER — ONDANSETRON HCL 4 MG/2ML IJ SOLN
INTRAMUSCULAR | Status: DC | PRN
  Administered 2014-12-08: 4 mg via INTRAVENOUS

## 2014-12-08 MED ORDER — CLINDAMYCIN PHOSPHATE 600 MG/50ML IV SOLN
INTRAVENOUS | Status: AC
  Administered 2014-12-08: 600 mg via INTRAVENOUS
  Filled 2014-12-08: qty 50

## 2014-12-08 MED ORDER — 0.9 % SODIUM CHLORIDE (POUR BTL) OPTIME
TOPICAL | Status: DC | PRN
  Administered 2014-12-08: 1000 mL

## 2014-12-08 MED ORDER — ROCURONIUM BROMIDE 100 MG/10ML IV SOLN: INTRAVENOUS | Status: DC | PRN

## 2014-12-08 MED ORDER — BACIT-POLY-NEO HC 1 % EX OINT
TOPICAL_OINTMENT | CUTANEOUS | Status: AC
  Filled 2014-12-08: qty 15

## 2014-12-08 MED ORDER — LIDOCAINE-EPINEPHRINE 1 %-1:100000 IJ SOLN
INTRAMUSCULAR | Status: AC
  Filled 2014-12-08: qty 1

## 2014-12-08 MED ORDER — DEXAMETHASONE SODIUM PHOSPHATE 10 MG/ML IJ SOLN
INTRAMUSCULAR | Status: AC
  Filled 2014-12-08: qty 1

## 2014-12-08 MED ORDER — PROPOFOL 10 MG/ML IV BOLUS
INTRAVENOUS | Status: DC | PRN
  Administered 2014-12-08: 200 mg via INTRAVENOUS

## 2014-12-08 MED ORDER — LIDOCAINE HCL (CARDIAC) 20 MG/ML IV SOLN
INTRAVENOUS | Status: AC
  Filled 2014-12-08: qty 5

## 2014-12-08 MED ORDER — FENTANYL CITRATE (PF) 250 MCG/5ML IJ SOLN
INTRAMUSCULAR | Status: AC
  Filled 2014-12-08: qty 5

## 2014-12-08 MED ORDER — OXYMETAZOLINE HCL 0.05 % NA SOLN
NASAL | Status: DC | PRN
  Administered 2014-12-08: 1 via NASAL

## 2014-12-08 MED ORDER — IBUPROFEN 100 MG/5ML PO SUSP: 200.0000 mg | Freq: Four times a day (QID) | ORAL | Status: DC | PRN

## 2014-12-08 MED ORDER — OXYCODONE HCL 5 MG/5ML PO SOLN
5.0000 mg | ORAL | Status: DC | PRN
  Administered 2014-12-09 (×3): 10 mg via ORAL
  Administered 2014-12-10: 5 mg via ORAL
  Administered 2014-12-10: 10 mg via ORAL
  Filled 2014-12-08 (×3): qty 10
  Filled 2014-12-08 (×2): qty 15
  Filled 2014-12-08: qty 10

## 2014-12-08 MED ORDER — LIDOCAINE-EPINEPHRINE 1 %-1:100000 IJ SOLN
INTRAMUSCULAR | Status: DC | PRN
  Administered 2014-12-08: 3 mL

## 2014-12-08 MED ORDER — DEXAMETHASONE SODIUM PHOSPHATE 10 MG/ML IJ SOLN
INTRAMUSCULAR | Status: DC | PRN
  Administered 2014-12-08: 10 mg via INTRAVENOUS

## 2014-12-08 MED ORDER — EPINEPHRINE HCL (NASAL) 0.1 % NA SOLN
NASAL | Status: AC
  Filled 2014-12-08: qty 30

## 2014-12-08 MED ORDER — SCOPOLAMINE 1 MG/3DAYS TD PT72
MEDICATED_PATCH | TRANSDERMAL | Status: AC
  Administered 2014-12-08: 1 via TRANSDERMAL
  Filled 2014-12-08: qty 1

## 2014-12-08 MED ORDER — OXYMETAZOLINE HCL 0.05 % NA SOLN
NASAL | Status: AC
  Filled 2014-12-08: qty 15

## 2014-12-08 MED ORDER — PROPOFOL 10 MG/ML IV BOLUS
INTRAVENOUS | Status: AC
  Filled 2014-12-08: qty 20

## 2014-12-08 MED ORDER — ROCURONIUM BROMIDE 50 MG/5ML IV SOLN
INTRAVENOUS | Status: AC
  Filled 2014-12-08: qty 1

## 2014-12-08 MED ORDER — SUCCINYLCHOLINE CHLORIDE 20 MG/ML IJ SOLN
INTRAMUSCULAR | Status: DC | PRN
  Administered 2014-12-08: 120 mg via INTRAVENOUS

## 2014-12-08 MED ORDER — KETOROLAC TROMETHAMINE 30 MG/ML IJ SOLN: 30.0000 mg | Freq: Once | INTRAMUSCULAR | Status: DC | PRN

## 2014-12-08 MED ORDER — CLINDAMYCIN PHOSPHATE 600 MG/50ML IV SOLN
600.0000 mg | Freq: Three times a day (TID) | INTRAVENOUS | Status: DC
  Administered 2014-12-08 – 2014-12-11 (×9): 600 mg via INTRAVENOUS
  Filled 2014-12-08 (×11): qty 50

## 2014-12-08 MED ORDER — IBUPROFEN 200 MG PO TABS: 200.0000 mg | ORAL_TABLET | Freq: Four times a day (QID) | ORAL | Status: DC | PRN

## 2014-12-08 MED ORDER — OXYCODONE HCL 5 MG/5ML PO SOLN: 5.0000 mg | Freq: Once | ORAL | Status: DC | PRN

## 2014-12-08 MED ORDER — FENTANYL CITRATE (PF) 250 MCG/5ML IJ SOLN
INTRAMUSCULAR | Status: DC | PRN
  Administered 2014-12-08 (×5): 50 ug via INTRAVENOUS

## 2014-12-08 MED ORDER — SUCCINYLCHOLINE CHLORIDE 20 MG/ML IJ SOLN
INTRAMUSCULAR | Status: AC
  Filled 2014-12-08: qty 1

## 2014-12-08 MED ORDER — OXYCODONE HCL 5 MG PO TABS: 5.0000 mg | ORAL_TABLET | Freq: Once | ORAL | Status: DC | PRN

## 2014-12-08 MED ORDER — MIDAZOLAM HCL 2 MG/2ML IJ SOLN
INTRAMUSCULAR | Status: AC
  Filled 2014-12-08: qty 2

## 2014-12-08 MED ORDER — HYDROMORPHONE HCL 1 MG/ML IJ SOLN: 0.2500 mg | INTRAMUSCULAR | Status: DC | PRN

## 2014-12-08 SURGICAL SUPPLY — 47 items
BIT DRILL RAINBOW 1.6X35 (BIT) ×3 IMPLANT
BIT DRILL TWIST 1.3X79 (BIT) ×1 IMPLANT
BLADE 10 SAFETY STRL DISP (BLADE) ×3 IMPLANT
BLADE SURG 15 STRL LF DISP TIS (BLADE) IMPLANT
BLADE SURG 15 STRL SS (BLADE)
CANISTER SUCTION 2500CC (MISCELLANEOUS) ×3 IMPLANT
CLEANER TIP ELECTROSURG 2X2 (MISCELLANEOUS) ×3 IMPLANT
CONFORMERS SILICONE 5649 (OPHTHALMIC RELATED) IMPLANT
COVER SURGICAL LIGHT HANDLE (MISCELLANEOUS) ×6 IMPLANT
CRADLE DONUT ADULT HEAD (MISCELLANEOUS) IMPLANT
DECANTER SPIKE VIAL GLASS SM (MISCELLANEOUS) ×3 IMPLANT
DRAPE PROXIMA HALF (DRAPES) IMPLANT
DRILL BIT TWIST 1.3X79MM (BIT) ×2
ELECT COATED BLADE 2.86 ST (ELECTRODE) ×3 IMPLANT
ELECT NEEDLE BLADE 2-5/6 (NEEDLE) IMPLANT
ELECT REM PT RETURN 9FT ADLT (ELECTROSURGICAL) ×3
ELECTRODE REM PT RTRN 9FT ADLT (ELECTROSURGICAL) ×1 IMPLANT
GLOVE ECLIPSE 7.5 STRL STRAW (GLOVE) ×3 IMPLANT
GOWN STRL REUS W/ TWL LRG LVL3 (GOWN DISPOSABLE) ×2 IMPLANT
GOWN STRL REUS W/TWL LRG LVL3 (GOWN DISPOSABLE) ×4
KIT BASIN OR (CUSTOM PROCEDURE TRAY) ×3 IMPLANT
KIT ROOM TURNOVER OR (KITS) ×3 IMPLANT
NEEDLE HYPO 25GX1X1/2 BEV (NEEDLE) ×3 IMPLANT
NS IRRIG 1000ML POUR BTL (IV SOLUTION) ×3 IMPLANT
PAD ARMBOARD 7.5X6 YLW CONV (MISCELLANEOUS) ×6 IMPLANT
PATTIES SURGICAL .5 X3 (DISPOSABLE) IMPLANT
PENCIL FOOT CONTROL (ELECTRODE) ×3 IMPLANT
PLATE 4 H FRACTURE C SHAPE (Plate) ×3 IMPLANT
PLATE ORBITAL RIM MID FACE (Plate) ×3 IMPLANT
SCREW BONE CROSS PIN 2.0X10MM (Screw) ×3 IMPLANT
SCREW LOCK SELFDRIL 2.0X8M MMF (Screw) ×39 IMPLANT
SCREW LOCKING 1.7X4MM (Screw) ×21 IMPLANT
SCREW MIDFACE 1.7X3 SLF DRILL (Screw) ×3 IMPLANT
SCREW MNDBLE 2.0X8 BONE (Screw) ×9 IMPLANT
SUT CHROMIC 3 0 PS 2 (SUTURE) ×3 IMPLANT
SUT CHROMIC 4 0 PS 2 18 (SUTURE) ×3 IMPLANT
SUT ETHILON 5 0 P 3 18 (SUTURE) ×2
SUT NYLON ETHILON 5-0 P-3 1X18 (SUTURE) ×1 IMPLANT
SUT SILK 2 0 FS (SUTURE) IMPLANT
SUT STEEL 0 (SUTURE)
SUT STEEL 0 18XMFL TIE 17 (SUTURE) IMPLANT
SUT STEEL 4 (SUTURE) ×3 IMPLANT
SYR CONTROL 10ML LL (SYRINGE) ×3 IMPLANT
TOWEL OR 17X24 6PK STRL BLUE (TOWEL DISPOSABLE) ×3 IMPLANT
TRAY ENT MC OR (CUSTOM PROCEDURE TRAY) ×3 IMPLANT
WATER STERILE IRR 1000ML POUR (IV SOLUTION) ×3 IMPLANT
arch bar (Dental) ×6 IMPLANT

## 2014-12-08 NOTE — Progress Notes (Signed)
PT Cancellation Note  Patient Details Name: Patrick Sutton MRN: [id number] DOB: 04-06-1991   Cancelled Treatment:    Reason Eval/Treat Not Completed: Pain limiting ability to participate.  As well as going to OR today therefore will attempt therapy evaluation tomorrow if indicated.    Evy Lutterman 12/08/2014, 8:52 AM  [name], PT DPT [phone]

## 2014-12-08 NOTE — Anesthesia Preprocedure Evaluation (Addendum)
Anesthesia Evaluation  Patient identified by MRN, date of birth, ID band Patient awake  General Assessment Comment:Sleepy, arousable  Reviewed: Allergy & Precautions, NPO status , Patient's Chart, lab work & pertinent test results  Airway Mallampati: I  TM Distance: >3 FB Neck ROM: Limited  Mouth opening: Limited Mouth Opening  Dental  (+) Dental Advisory Given   Pulmonary asthma ,  breath sounds clear to auscultation        Cardiovascular Rhythm:Regular Rate:Normal     Neuro/Psych    GI/Hepatic   Endo/Other    Renal/GU      Musculoskeletal   Abdominal   Peds  Hematology   Anesthesia Other Findings C3, c4 fractures.  C-Collar on.  Reproductive/Obstetrics                            Anesthesia Physical Anesthesia Plan  ASA: II  Anesthesia Plan: General   Post-op Pain Management:    Induction: Intravenous  Airway Management Planned: Nasal ETT and Video Laryngoscope Planned  Additional Equipment:   Intra-op Plan:   Post-operative Plan: Extubation in OR  Informed Consent: I have reviewed the patients History and Physical, chart, labs and discussed the procedure including the risks, benefits and alternatives for the proposed anesthesia with the patient or authorized representative who has indicated his/her understanding and acceptance.   Dental advisory given  Plan Discussed with: CRNA, Surgeon and Anesthesiologist  Anesthesia Plan Comments:         Anesthesia Quick Evaluation
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
## 2014-12-08 NOTE — Transfer of Care (Signed)
Immediate Anesthesia Transfer of Care Note  Patient: Patrick FunkSteven R Dicarlo  Procedure(s) Performed: Procedure(s): OPEN REDUCTION INTERNAL FIXATION (ORIF) MANDIBULAR FRACTURE (N/A)  Patient Location: PACU  Anesthesia Type:General  Level of Consciousness: sedated  Airway & Oxygen Therapy: Patient Spontanous Breathing and Patient connected to nasal cannula oxygen  Post-op Assessment: Report given to RN and Post -op Vital signs reviewed and stable  Post vital signs: Reviewed and stable  Last Vitals:  Filed Vitals:   12/08/14 0800  BP: 128/92  Pulse: 67  Temp:   Resp: 13    Complications: No apparent anesthesia complications

## 2014-12-08 NOTE — Anesthesia Postprocedure Evaluation (Signed)
  Anesthesia Post-op Note  Patient: Patrick Sutton  Procedure(s) Performed: Procedure(s): OPEN REDUCTION INTERNAL FIXATION (ORIF) MANDIBULAR FRACTURE (N/A)  Patient Location: PACU  Anesthesia Type: General   Level of Consciousness: awake, alert  and oriented  Airway and Oxygen Therapy: Patient Spontanous Breathing  Post-op Pain: mild  Post-op Assessment: Post-op Vital signs reviewed  Post-op Vital Signs: Reviewed  Last Vitals:  Filed Vitals:   12/08/14 1300  BP: 144/88  Pulse: 100  Temp:   Resp: 16    Complications: No apparent anesthesia complications

## 2014-12-08 NOTE — OR Nursing (Signed)
Wire cut scissors to pacu with patient.

## 2014-12-08 NOTE — Progress Notes (Signed)
Wire cutters at bedside 

## 2014-12-08 NOTE — Anesthesia Procedure Notes (Addendum)
Procedure Name: Intubation Date/Time: 12/08/2014 10:21 AM Performed by: Alanda AmassFRIEDMAN, Athziry Millican A Pre-anesthesia Checklist: Patient identified, Timeout performed, Emergency Drugs available, Suction available and Patient being monitored Patient Re-evaluated:Patient Re-evaluated prior to inductionOxygen Delivery Method: Circle system utilized Preoxygenation: Pre-oxygenation with 100% oxygen Intubation Type: IV induction Ventilation: Nasal airway inserted- appropriate to patient size and Mask ventilation without difficulty Laryngoscope Size: Glidescope Nasal Tubes: Nasal Rae, Right and Nasal prep performed Tube size: 7.0 mm Number of attempts: 1 Airway Equipment and Method: Video-laryngoscopy Placement Confirmation: ETT inserted through vocal cords under direct vision,  breath sounds checked- equal and bilateral and positive ETCO2 Secured at: 28 cm Tube secured with: Tape Dental Injury: Teeth and Oropharynx as per pre-operative assessment
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
## 2014-12-08 NOTE — Progress Notes (Signed)
Called report to primary RN in short stay. Notified RN no order for consent to be signed. Will continue to monitor.

## 2014-12-08 NOTE — Progress Notes (Signed)
Pt arrived back to unit from pacu. Wire cutters above head of bed. Will continue to monitor.

## 2014-12-09 ENCOUNTER — Inpatient Hospital Stay (HOSPITAL_COMMUNITY): Payer: Medicaid Other

## 2014-12-09 LAB — URINE MICROSCOPIC-ADD ON

## 2014-12-09 LAB — URINALYSIS, ROUTINE W REFLEX MICROSCOPIC
Glucose, UA: NEGATIVE mg/dL
Ketones, ur: 15 mg/dL — AB
Leukocytes, UA: NEGATIVE
NITRITE: NEGATIVE
PH: 7 (ref 5.0–8.0)
Protein, ur: NEGATIVE mg/dL
Specific Gravity, Urine: 1.023 (ref 1.005–1.030)
Urobilinogen, UA: 4 mg/dL — ABNORMAL HIGH (ref 0.0–1.0)
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
## 2014-12-10 ENCOUNTER — Inpatient Hospital Stay (HOSPITAL_COMMUNITY): Payer: Medicaid Other

## 2014-12-10 LAB — CBC
HEMATOCRIT: 43.9 % (ref 39.0–52.0)
Hemoglobin: 14.7 g/dL (ref 13.0–17.0)
MCH: 30.2 pg (ref 26.0–34.0)
MCHC: 33.5 g/dL (ref 30.0–36.0)
MCV: 90.3 fL (ref 78.0–100.0)
Platelets: 194 10*3/uL (ref 150–400)
RBC: 4.86 MIL/uL (ref 4.22–5.81)
RDW: 12.5 % (ref 11.5–15.5)
WBC: 10.5 10*3/uL (ref 4.0–10.5)

## 2014-12-10 LAB — BASIC METABOLIC PANEL
ANION GAP: 11 (ref 5–15)
BUN: 18 mg/dL (ref 6–23)
CHLORIDE: 98 mmol/L (ref 96–112)
CO2: 26 mmol/L (ref 19–32)
CREATININE: 0.97 mg/dL (ref 0.50–1.35)
Calcium: 9 mg/dL (ref 8.4–10.5)
GFR calc non Af Amer: >90 mL/min (ref >90)
GLUCOSE: 94 mg/dL (ref 70–99)
Potassium: 3.9 mmol/L (ref 3.5–5.1)
Sodium: 135 mmol/L (ref 135–145)

## 2014-12-10 MED ORDER — DOCUSATE SODIUM 50 MG/5ML PO LIQD
100.0000 mg | Freq: Two times a day (BID) | ORAL | Status: DC
  Administered 2014-12-10 (×2): 100 mg via ORAL
  Filled 2014-12-10 (×5): qty 10

## 2014-12-10 MED ORDER — POLYVINYL ALCOHOL 1.4 % OP SOLN
1.0000 [drp] | OPHTHALMIC | Status: DC | PRN
  Filled 2014-12-10: qty 15

## 2014-12-10 MED ORDER — SODIUM CHLORIDE 0.9 % IJ SOLN: 3.0000 mL | INTRAMUSCULAR | Status: DC | PRN

## 2014-12-10 MED ORDER — OXYCODONE HCL 5 MG/5ML PO SOLN: 5.0000 mg | ORAL | Status: DC | PRN

## 2014-12-10 MED ORDER — OXYCODONE HCL 5 MG PO TABS: 5.0000 mg | ORAL_TABLET | ORAL | Status: DC | PRN

## 2014-12-10 MED FILL — Ondansetron HCl Inj 4 MG/2ML (2 MG/ML): INTRAMUSCULAR | Qty: 2 | Status: AC

## 2014-12-10 MED FILL — Fentanyl Citrate Preservative Free (PF) Inj 100 MCG/2ML: INTRAMUSCULAR | Qty: 2 | Status: AC
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
## 2014-12-10 NOTE — Clinical Social Work Note (Signed)
Clinical Social Worker will sign off for now as social work intervention is no longer needed. Please consult us again if new need arises.  Jesse Summerlyn Fickel, LCSW [phone]  
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
## 2014-12-11 ENCOUNTER — Inpatient Hospital Stay (HOSPITAL_COMMUNITY): Payer: Medicaid Other

## 2014-12-11 DIAGNOSIS — S02609A Fracture of mandible, unspecified, initial encounter for closed fracture: Secondary | ICD-10-CM | POA: Diagnosis present

## 2014-12-11 DIAGNOSIS — M25562 Pain in left knee: Secondary | ICD-10-CM | POA: Diagnosis present

## 2014-12-11 DIAGNOSIS — S060X9A Concussion with loss of consciousness of unspecified duration, initial encounter: Secondary | ICD-10-CM | POA: Diagnosis present

## 2014-12-11 DIAGNOSIS — S12300A Unspecified displaced fracture of fourth cervical vertebra, initial encounter for closed fracture: Secondary | ICD-10-CM | POA: Diagnosis present

## 2014-12-11 DIAGNOSIS — S27322A Contusion of lung, bilateral, initial encounter: Secondary | ICD-10-CM | POA: Diagnosis present

## 2014-12-11 DIAGNOSIS — S2232XA Fracture of one rib, left side, initial encounter for closed fracture: Secondary | ICD-10-CM | POA: Diagnosis present

## 2014-12-11 DIAGNOSIS — S02401A Maxillary fracture, unspecified, initial encounter for closed fracture: Secondary | ICD-10-CM | POA: Diagnosis present

## 2014-12-11 DIAGNOSIS — S060XAA Concussion with loss of consciousness status unknown, initial encounter: Secondary | ICD-10-CM | POA: Diagnosis present

## 2014-12-11 MED ORDER — HYDROCODONE-ACETAMINOPHEN 7.5-325 MG/15ML PO SOLN
10.0000 mL | ORAL | Status: DC | PRN
  Administered 2014-12-11 (×2): 15 mL via ORAL
  Filled 2014-12-11 (×2): qty 15

## 2014-12-11 MED ORDER — IBUPROFEN 100 MG/5ML PO SUSP: 800.0000 mg | Freq: Three times a day (TID) | ORAL | Status: DC

## 2014-12-11 MED ORDER — IBUPROFEN 100 MG/5ML PO SUSP
800.0000 mg | Freq: Three times a day (TID) | ORAL | Status: DC
  Administered 2014-12-11: 800 mg via ORAL
  Filled 2014-12-11 (×8): qty 40

## 2014-12-11 MED ORDER — CEPHALEXIN 250 MG/5ML PO SUSR
250.0000 mg | Freq: Four times a day (QID) | ORAL | Status: DC
  Administered 2014-12-11 (×3): 250 mg via ORAL
  Filled 2014-12-11 (×5): qty 5

## 2014-12-11 MED ORDER — WHITE PETROLATUM GEL
Status: AC
  Administered 2014-12-11: 04:00:00
  Filled 2014-12-11: qty 1

## 2014-12-11 MED ORDER — HYDROCODONE-ACETAMINOPHEN 7.5-325 MG/15ML PO SOLN: 10.0000 mL | ORAL | Status: DC | PRN

## 2014-12-11 MED ORDER — CEPHALEXIN 250 MG/5ML PO SUSR: 250.0000 mg | Freq: Four times a day (QID) | ORAL | Status: DC
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
## 2014-12-11 NOTE — Progress Notes (Signed)
Orthopedic Tech Progress Note Patient Details:  Patrick Sutton 11/16/1990 [id number]  Ortho Devices Type of Ortho Device: [place] cervical collar Ortho Device/Splint Location: neck Ortho Device/Splint Interventions: Ordered Viewed order from doctor's order list  Nikki DomCrawford, Arsh Feutz 12/11/2014, 2:54 PM
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
## 2014-12-11 NOTE — Care Management (Signed)
ED CM received call from Rocquita RN on floor, concerning patient  MATCH letter.not being processed. CM Updated information in PDMI contacted pharmacy MATCH processed. No further CM needs identified.

## 2014-12-11 NOTE — Discharge Instructions (Signed)
Wash wounds daily in shower with soap and water. °Do not soak. °Apply antibiotic ointment (e.g. Neosporin) twice daily and as needed to keep moist. °Cover with dry dressing. ° °No driving while taking hydrocodone. °

## 2014-12-11 NOTE — Progress Notes (Signed)
Spoke with patient and wife about HH/DME orders for discharge. Wife confirmed that they had already arranged to get a rolling walker and a bedside commode from family/friend and it would be at the house today when they arrived home.  Spoke with Judeth CornfieldStephanie with [facility] and according to Medicaid guidelines, he does not have a qualifying diagnosis for PT or OT.  I had explained to the wife and the patient that this was a possibility.  I will go and advise them that no therapy will be coming.  [facility].  Provided with letter, list of participating pharmacies and an explanation of the process. Pt/caregiver stated understanding.  Carlyle LipaMichelle Venora Kautzman, RN BSN MHA CCM  Case Manager, Trauma Service/Unit [facility] [phone]

## 2014-12-11 NOTE — Progress Notes (Signed)
Discharge paperwork given to patient. Questions answered. Supplies for dressing change given to patient. Wired cutter packed with patient's belongings. Prescriptions given. Patient awaiting ride and will be ready for discharge.

## 2014-12-11 NOTE — Progress Notes (Signed)
SATURATION QUALIFICATIONS: (This note is used to comply with regulatory documentation for home oxygen)  Patient Saturations on Room Air at Rest = 94  Patient Saturations on Room Air while Ambulating =92  Patient Saturations on 1 Liters of oxygen while Ambulating = 95  Please briefly explain why patient needs home oxygen:

## 2014-12-12 ENCOUNTER — Encounter (HOSPITAL_COMMUNITY): Payer: Self-pay | Admitting: Otolaryngology

## 2014-12-13 ENCOUNTER — Telehealth (HOSPITAL_COMMUNITY): Payer: Self-pay

## 2014-12-13 NOTE — Telephone Encounter (Signed)
Wife called to say pt developed some new N/T in fingers of one hand. I advised her to call Dr. Fredrich BirksStern's office and report new symptoms to his office.

## 2014-12-19 ENCOUNTER — Encounter (HOSPITAL_COMMUNITY): Payer: Self-pay | Admitting: Otolaryngology

## 2015-01-04 ENCOUNTER — Encounter (HOSPITAL_COMMUNITY): Payer: Self-pay | Admitting: Otolaryngology

## 2015-01-21 ENCOUNTER — Encounter (HOSPITAL_BASED_OUTPATIENT_CLINIC_OR_DEPARTMENT_OTHER): Payer: Self-pay | Admitting: *Deleted
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
## 2015-01-22 ENCOUNTER — Ambulatory Visit (HOSPITAL_BASED_OUTPATIENT_CLINIC_OR_DEPARTMENT_OTHER): Payer: Medicaid Other | Admitting: Certified Registered"

## 2015-01-22 ENCOUNTER — Encounter (HOSPITAL_BASED_OUTPATIENT_CLINIC_OR_DEPARTMENT_OTHER)
Admission: RE | Disposition: A | Discharge status: Home / Self Care | Payer: Self-pay | Source: Ambulatory Visit | Attending: Otolaryngology

## 2015-01-22 ENCOUNTER — Ambulatory Visit (HOSPITAL_BASED_OUTPATIENT_CLINIC_OR_DEPARTMENT_OTHER)
Admission: RE | Admit: 2015-01-22 | Discharge: 2015-01-22 | Disposition: A | Discharge status: Home / Self Care | Payer: Medicaid Other | Source: Ambulatory Visit | Attending: Otolaryngology | Admitting: Otolaryngology

## 2015-01-22 ENCOUNTER — Encounter (HOSPITAL_BASED_OUTPATIENT_CLINIC_OR_DEPARTMENT_OTHER): Payer: Self-pay | Admitting: Certified Registered"

## 2015-01-22 DIAGNOSIS — Z79891 Long term (current) use of opiate analgesic: Secondary | ICD-10-CM | POA: Insufficient documentation

## 2015-01-22 DIAGNOSIS — Z79899 Other long term (current) drug therapy: Secondary | ICD-10-CM | POA: Insufficient documentation

## 2015-01-22 DIAGNOSIS — F17220 Nicotine dependence, chewing tobacco, uncomplicated: Secondary | ICD-10-CM | POA: Insufficient documentation

## 2015-01-22 DIAGNOSIS — X58XXXD Exposure to other specified factors, subsequent encounter: Secondary | ICD-10-CM | POA: Insufficient documentation

## 2015-01-22 DIAGNOSIS — S02609D Fracture of mandible, unspecified, subsequent encounter for fracture with routine healing: Secondary | ICD-10-CM | POA: Diagnosis present

## 2015-01-22 DIAGNOSIS — J45909 Unspecified asthma, uncomplicated: Secondary | ICD-10-CM | POA: Diagnosis not present

## 2015-01-22 DIAGNOSIS — Z791 Long term (current) use of non-steroidal anti-inflammatories (NSAID): Secondary | ICD-10-CM | POA: Diagnosis not present

## 2015-01-22 HISTORY — DX: Fracture of neck, unspecified, initial encounter: S12.9XXA

## 2015-01-22 HISTORY — DX: Other seasonal allergic rhinitis: J30.2

## 2015-01-22 HISTORY — PX: MANDIBULAR HARDWARE REMOVAL: SHX5205

## 2015-01-22 HISTORY — DX: Personal history of traumatic brain injury: Z87.820

## 2015-01-22 HISTORY — DX: Fracture of mandible, unspecified, initial encounter for closed fracture: S02.609A

## 2015-01-22 LAB — POCT HEMOGLOBIN-HEMACUE: HEMOGLOBIN: 15.5 g/dL (ref 13.0–17.0)

## 2015-01-22 SURGERY — REMOVAL, HARDWARE, MANDIBLE
Anesthesia: Monitor Anesthesia Care

## 2015-01-22 MED ORDER — GLYCOPYRROLATE 0.2 MG/ML IJ SOLN: 0.2000 mg | Freq: Once | INTRAMUSCULAR | Status: DC | PRN

## 2015-01-22 MED ORDER — MIDAZOLAM HCL 2 MG/2ML IJ SOLN
INTRAMUSCULAR | Status: AC
  Filled 2015-01-22: qty 2

## 2015-01-22 MED ORDER — LIDOCAINE-EPINEPHRINE 1 %-1:100000 IJ SOLN
INTRAMUSCULAR | Status: AC
  Filled 2015-01-22: qty 1

## 2015-01-22 MED ORDER — LIDOCAINE HCL (CARDIAC) 20 MG/ML IV SOLN
INTRAVENOUS | Status: DC | PRN
  Administered 2015-01-22: 25 mg via INTRAVENOUS

## 2015-01-22 MED ORDER — ONDANSETRON HCL 4 MG/2ML IJ SOLN: 4.0000 mg | Freq: Once | INTRAMUSCULAR | Status: DC | PRN

## 2015-01-22 MED ORDER — LIDOCAINE-EPINEPHRINE 1 %-1:100000 IJ SOLN
INTRAMUSCULAR | Status: DC | PRN
  Administered 2015-01-22: 1 mL

## 2015-01-22 MED ORDER — PROPOFOL INFUSION 10 MG/ML OPTIME: INTRAVENOUS | Status: DC | PRN

## 2015-01-22 MED ORDER — PROPOFOL INFUSION 10 MG/ML OPTIME
INTRAVENOUS | Status: DC | PRN
  Administered 2015-01-22: 200 ug/kg/min via INTRAVENOUS

## 2015-01-22 MED ORDER — FENTANYL CITRATE (PF) 100 MCG/2ML IJ SOLN: 50.0000 ug | INTRAMUSCULAR | Status: DC | PRN

## 2015-01-22 MED ORDER — ONDANSETRON HCL 4 MG/2ML IJ SOLN
INTRAMUSCULAR | Status: DC | PRN
Start: 2015-01-22 — End: 2015-01-22
  Administered 2015-01-22: 4 mg via INTRAVENOUS

## 2015-01-22 MED ORDER — HYDROCODONE-ACETAMINOPHEN 7.5-325 MG PO TABS: 1.0000 | ORAL_TABLET | Freq: Once | ORAL | Status: DC | PRN

## 2015-01-22 MED ORDER — MIDAZOLAM HCL 2 MG/2ML IJ SOLN
1.0000 mg | INTRAMUSCULAR | Status: DC | PRN
  Administered 2015-01-22: 2 mg via INTRAVENOUS

## 2015-01-22 MED ORDER — HYDROMORPHONE HCL 1 MG/ML IJ SOLN: 0.2500 mg | INTRAMUSCULAR | Status: DC | PRN

## 2015-01-22 MED ORDER — FENTANYL CITRATE (PF) 100 MCG/2ML IJ SOLN
INTRAMUSCULAR | Status: AC
  Filled 2015-01-22: qty 4

## 2015-01-22 MED ORDER — BACITRACIN ZINC 500 UNIT/GM EX OINT
TOPICAL_OINTMENT | CUTANEOUS | Status: AC
  Filled 2015-01-22: qty 0.9

## 2015-01-22 MED ORDER — PROPOFOL 500 MG/50ML IV EMUL
INTRAVENOUS | Status: AC
  Filled 2015-01-22: qty 50

## 2015-01-22 MED ORDER — FENTANYL CITRATE (PF) 100 MCG/2ML IJ SOLN
INTRAMUSCULAR | Status: DC | PRN
  Administered 2015-01-22 (×2): 50 ug via INTRAVENOUS

## 2015-01-22 MED ORDER — SUCCINYLCHOLINE CHLORIDE 20 MG/ML IJ SOLN
INTRAMUSCULAR | Status: AC
  Filled 2015-01-22: qty 2

## 2015-01-22 MED ORDER — PROPOFOL 10 MG/ML IV BOLUS
INTRAVENOUS | Status: DC | PRN
  Administered 2015-01-22: 30 mg via INTRAVENOUS

## 2015-01-22 MED ORDER — BACITRACIN 500 UNIT/GM EX OINT
TOPICAL_OINTMENT | CUTANEOUS | Status: DC | PRN
  Administered 2015-01-22: 1 via TOPICAL

## 2015-01-22 MED ORDER — LACTATED RINGERS IV SOLN
INTRAVENOUS | Status: DC
  Administered 2015-01-22 (×2): via INTRAVENOUS

## 2015-01-22 SURGICAL SUPPLY — 23 items
BLADE SURG 15 STRL LF DISP TIS (BLADE) ×1 IMPLANT
BLADE SURG 15 STRL SS (BLADE) ×2
CANISTER SUCT 1200ML W/VALVE (MISCELLANEOUS) ×3 IMPLANT
CORDS BIPOLAR (ELECTRODE) IMPLANT
COVER MAYO STAND STRL (DRAPES) ×3 IMPLANT
DECANTER SPIKE VIAL GLASS SM (MISCELLANEOUS) ×3 IMPLANT
GLOVE SS BIOGEL STRL SZ 7.5 (GLOVE) ×1 IMPLANT
GLOVE SUPERSENSE BIOGEL SZ 7.5 (GLOVE) ×2
GLOVE SURG SS PI 7.0 STRL IVOR (GLOVE) ×3 IMPLANT
GOWN STRL REUS W/ TWL LRG LVL3 (GOWN DISPOSABLE) ×1 IMPLANT
GOWN STRL REUS W/TWL LRG LVL3 (GOWN DISPOSABLE) ×2
MARKER SKIN DUAL TIP RULER LAB (MISCELLANEOUS) IMPLANT
NEEDLE PRECISIONGLIDE 27X1.5 (NEEDLE) IMPLANT
PACK BASIN DAY SURGERY FS (CUSTOM PROCEDURE TRAY) ×3 IMPLANT
SCISSORS WIRE ANG 4 3/4 DISP (INSTRUMENTS) IMPLANT
SHEET MEDIUM DRAPE 40X70 STRL (DRAPES) ×3 IMPLANT
SUT CHROMIC 3 0 PS 2 (SUTURE) IMPLANT
SUT CHROMIC 4 0 PS 2 18 (SUTURE) IMPLANT
SYR CONTROL 10ML LL (SYRINGE) IMPLANT
TOWEL OR 17X24 6PK STRL BLUE (TOWEL DISPOSABLE) ×6 IMPLANT
TUBE CONNECTING 20'X1/4 (TUBING) ×1
TUBE CONNECTING 20X1/4 (TUBING) ×2 IMPLANT
YANKAUER SUCT BULB TIP NO VENT (SUCTIONS) ×3 IMPLANT
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
## 2015-01-22 NOTE — Transfer of Care (Signed)
Immediate Anesthesia Transfer of Care Note  Patient: Patrick Sutton  Procedure(s) Performed: Procedure(s): REMOVAL OF ARCH BARS (N/A)  Patient Location: PACU  Anesthesia Type:MAC  Level of Consciousness: awake, alert  and oriented  Airway & Oxygen Therapy: Patient Spontanous Breathing  Post-op Assessment: Report given to RN, Post -op Vital signs reviewed and stable and Patient moving all extremities  Post vital signs: Reviewed and stable  Last Vitals:  Filed Vitals:   01/22/15 1102  BP:   Pulse: 74  Temp:   Resp: 16    Complications: No apparent anesthesia complications

## 2015-01-22 NOTE — Anesthesia Postprocedure Evaluation (Signed)
  Anesthesia Post-op Note  Patient: Kirby FunkSteven R Krohn  Procedure(s) Performed: Procedure(s): REMOVAL OF ARCH BARS (N/A)  Patient Location: PACU  Anesthesia Type:MAC  Level of Consciousness: awake, alert  and oriented  Airway and Oxygen Therapy: Patient Spontanous Breathing and Patient connected to nasal cannula oxygen  Post-op Pain: none  Post-op Assessment: Post-op Vital signs reviewed, Patient's Cardiovascular Status Stable, Respiratory Function Stable, Patent Airway and Pain level controlled  Post-op Vital Signs: stable  Last Vitals:  Filed Vitals:   01/22/15 1145  BP: 127/88  Pulse: 60  Temp: 36.8 C  Resp: 18    Complications: No apparent anesthesia complications

## 2015-01-22 NOTE — Anesthesia Procedure Notes (Signed)
Procedure Name: MAC Date/Time: 01/22/2015 10:40 AM Performed by: Curly ShoresRAFT, Vella Colquitt W Pre-anesthesia Checklist: Patient identified, Emergency Drugs available, Suction available, Patient being monitored and Timeout performed Patient Re-evaluated:Patient Re-evaluated prior to inductionOxygen Delivery Method: Simple face mask Preoxygenation: Pre-oxygenation with 100% oxygen Intubation Type: IV induction Ventilation: Mask ventilation without difficulty Dental Injury: Teeth and Oropharynx as per pre-operative assessment
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
## 2015-01-22 NOTE — Discharge Instructions (Signed)

## 2015-01-24 ENCOUNTER — Encounter (HOSPITAL_BASED_OUTPATIENT_CLINIC_OR_DEPARTMENT_OTHER): Payer: Self-pay | Admitting: Otolaryngology

## 2015-06-02 ENCOUNTER — Emergency Department (HOSPITAL_COMMUNITY)
Admission: EM | Admit: 2015-06-02 | Discharge: 2015-06-02 | Disposition: A | Discharge status: Home / Self Care | Payer: Medicaid Other | Attending: Emergency Medicine | Admitting: Emergency Medicine

## 2015-06-02 ENCOUNTER — Emergency Department (HOSPITAL_COMMUNITY): Payer: Medicaid Other

## 2015-06-02 ENCOUNTER — Encounter (HOSPITAL_COMMUNITY): Payer: Self-pay | Admitting: Emergency Medicine

## 2015-06-02 DIAGNOSIS — Z8781 Personal history of (healed) traumatic fracture: Secondary | ICD-10-CM | POA: Diagnosis not present

## 2015-06-02 DIAGNOSIS — Z79899 Other long term (current) drug therapy: Secondary | ICD-10-CM | POA: Insufficient documentation

## 2015-06-02 DIAGNOSIS — J45909 Unspecified asthma, uncomplicated: Secondary | ICD-10-CM | POA: Insufficient documentation

## 2015-06-02 DIAGNOSIS — R6884 Jaw pain: Secondary | ICD-10-CM | POA: Diagnosis present

## 2015-06-02 MED ORDER — TRAMADOL HCL 50 MG PO TABS: 50.0000 mg | ORAL_TABLET | Freq: Four times a day (QID) | ORAL | Status: DC | PRN

## 2015-06-02 MED ORDER — PENICILLIN V POTASSIUM 500 MG PO TABS: 500.0000 mg | ORAL_TABLET | Freq: Four times a day (QID) | ORAL | Status: AC
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
## 2016-04-20 ENCOUNTER — Emergency Department (HOSPITAL_COMMUNITY)
Admission: EM | Admit: 2016-04-20 | Discharge: 2016-04-21 | Disposition: A | Discharge status: Home / Self Care | Payer: Medicaid Other | Attending: Emergency Medicine | Admitting: Emergency Medicine

## 2016-04-20 ENCOUNTER — Encounter (HOSPITAL_COMMUNITY): Payer: Self-pay | Admitting: Emergency Medicine

## 2016-04-20 DIAGNOSIS — R112 Nausea with vomiting, unspecified: Secondary | ICD-10-CM

## 2016-04-20 DIAGNOSIS — J45909 Unspecified asthma, uncomplicated: Secondary | ICD-10-CM | POA: Diagnosis not present

## 2016-04-20 DIAGNOSIS — R197 Diarrhea, unspecified: Secondary | ICD-10-CM | POA: Insufficient documentation

## 2016-04-20 DIAGNOSIS — R1084 Generalized abdominal pain: Secondary | ICD-10-CM | POA: Diagnosis not present

## 2016-04-20 DIAGNOSIS — F17220 Nicotine dependence, chewing tobacco, uncomplicated: Secondary | ICD-10-CM | POA: Diagnosis not present

## 2016-04-20 DIAGNOSIS — R109 Unspecified abdominal pain: Secondary | ICD-10-CM

## 2016-04-20 DIAGNOSIS — Z79899 Other long term (current) drug therapy: Secondary | ICD-10-CM | POA: Insufficient documentation

## 2016-04-20 LAB — CBC WITH DIFFERENTIAL/PLATELET
BASOS PCT: 0 %
Basophils Absolute: 0 10*3/uL (ref 0.0–0.1)
EOS PCT: 5 %
Eosinophils Absolute: 0.4 10*3/uL (ref 0.0–0.7)
HCT: 44.8 % (ref 39.0–52.0)
Hemoglobin: 15.4 g/dL (ref 13.0–17.0)
Lymphocytes Relative: 11 %
Lymphs Abs: 0.9 10*3/uL (ref 0.7–4.0)
MCH: 30.4 pg (ref 26.0–34.0)
MCHC: 34.4 g/dL (ref 30.0–36.0)
MCV: 88.4 fL (ref 78.0–100.0)
MONO ABS: 0.5 10*3/uL (ref 0.1–1.0)
MONOS PCT: 6 %
NEUTROS PCT: 78 %
Neutro Abs: 6.3 10*3/uL (ref 1.7–7.7)
PLATELETS: 159 10*3/uL (ref 150–400)
RBC: 5.07 MIL/uL (ref 4.22–5.81)
RDW: 12.4 % (ref 11.5–15.5)
WBC: 8 10*3/uL (ref 4.0–10.5)

## 2016-04-20 LAB — URINE MICROSCOPIC-ADD ON

## 2016-04-20 LAB — URINALYSIS, ROUTINE W REFLEX MICROSCOPIC
Bilirubin Urine: NEGATIVE
GLUCOSE, UA: NEGATIVE mg/dL
KETONES UR: NEGATIVE mg/dL
LEUKOCYTES UA: NEGATIVE
NITRITE: NEGATIVE
PROTEIN: NEGATIVE mg/dL
Specific Gravity, Urine: >1.03 — ABNORMAL HIGH (ref 1.005–1.030)
pH: 6 (ref 5.0–8.0)

## 2016-04-20 LAB — COMPREHENSIVE METABOLIC PANEL
ALBUMIN: 4.9 g/dL (ref 3.5–5.0)
ALT: 34 U/L (ref 17–63)
AST: 34 U/L (ref 15–41)
Alkaline Phosphatase: 47 U/L (ref 38–126)
Anion gap: 10 (ref 5–15)
BILIRUBIN TOTAL: 0.9 mg/dL (ref 0.3–1.2)
BUN: 12 mg/dL (ref 6–20)
CALCIUM: 10.5 mg/dL — AB (ref 8.9–10.3)
CO2: 26 mmol/L (ref 22–32)
Chloride: 105 mmol/L (ref 101–111)
Creatinine, Ser: 0.95 mg/dL (ref 0.61–1.24)
GFR calc Af Amer: >60 mL/min (ref >60)
Glucose, Bld: 98 mg/dL (ref 65–99)
POTASSIUM: 3.7 mmol/L (ref 3.5–5.1)
Sodium: 141 mmol/L (ref 135–145)
Total Protein: 7.4 g/dL (ref 6.5–8.1)

## 2016-04-20 MED ORDER — LOPERAMIDE HCL 2 MG PO CAPS
4.0000 mg | ORAL_CAPSULE | Freq: Once | ORAL | Status: AC
  Administered 2016-04-20: 4 mg via ORAL
  Filled 2016-04-20: qty 2

## 2016-04-20 MED ORDER — SODIUM CHLORIDE 0.9 % IV BOLUS (SEPSIS)
1000.0000 mL | Freq: Once | INTRAVENOUS | Status: AC
Start: 2016-04-20 — End: 2016-04-21
  Administered 2016-04-20: 1000 mL via INTRAVENOUS

## 2016-04-20 MED ORDER — SODIUM CHLORIDE 0.9 % IV BOLUS (SEPSIS)
1000.0000 mL | Freq: Once | INTRAVENOUS | Status: AC
  Administered 2016-04-20: 1000 mL via INTRAVENOUS

## 2016-04-20 MED ORDER — ONDANSETRON HCL 4 MG/2ML IJ SOLN
4.0000 mg | Freq: Once | INTRAMUSCULAR | Status: AC
  Administered 2016-04-20: 4 mg via INTRAVENOUS
  Filled 2016-04-20: qty 2

## 2016-04-20 NOTE — ED Triage Notes (Signed)
Pt reports lower abdominal pain and diarrhea that started today.
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
## 2016-04-21 LAB — LIPASE, BLOOD: Lipase: 43 U/L (ref 11–51)

## 2016-04-21 MED ORDER — ONDANSETRON HCL 4 MG PO TABS: 4.0000 mg | ORAL_TABLET | Freq: Four times a day (QID) | ORAL | 0 refills | Status: DC

## 2016-04-21 NOTE — Discharge Instructions (Signed)
Drink plenty of fluids (clear liquids) this morning and if doing well start a bland diet at lunchtime (Jell-O, toast, crackers, [employer] chicken noodle soup) . Use the zofran for nausea or vomiting. Take imodium OTC for diarrhea. Avoid mild products until the diarrhea is gone. Recheck if you get worse.

## 2016-04-29 ENCOUNTER — Encounter (HOSPITAL_COMMUNITY): Payer: Self-pay | Admitting: Emergency Medicine

## 2016-04-29 ENCOUNTER — Emergency Department (HOSPITAL_COMMUNITY)
Admission: EM | Admit: 2016-04-29 | Discharge: 2016-04-29 | Disposition: A | Discharge status: Home / Self Care | Payer: Medicaid Other | Attending: Emergency Medicine | Admitting: Emergency Medicine

## 2016-04-29 DIAGNOSIS — Z79899 Other long term (current) drug therapy: Secondary | ICD-10-CM | POA: Diagnosis not present

## 2016-04-29 DIAGNOSIS — L255 Unspecified contact dermatitis due to plants, except food: Secondary | ICD-10-CM | POA: Diagnosis not present

## 2016-04-29 DIAGNOSIS — J45909 Unspecified asthma, uncomplicated: Secondary | ICD-10-CM | POA: Insufficient documentation

## 2016-04-29 DIAGNOSIS — H5712 Ocular pain, left eye: Secondary | ICD-10-CM | POA: Diagnosis present

## 2016-04-29 MED ORDER — DEXAMETHASONE 4 MG PO TABS: 4.0000 mg | ORAL_TABLET | Freq: Two times a day (BID) | ORAL | 0 refills | Status: DC

## 2016-04-29 MED ORDER — DEXAMETHASONE SODIUM PHOSPHATE 4 MG/ML IJ SOLN
8.0000 mg | Freq: Once | INTRAMUSCULAR | Status: AC
  Administered 2016-04-29: 8 mg via INTRAMUSCULAR
  Filled 2016-04-29: qty 2

## 2016-04-29 NOTE — Discharge Instructions (Signed)
Please use a cool compress to your eye 3 times daily. Please use Decadron 2 times daily with food. Please see your Medicaid access physician for additional evaluation and management if not improving.

## 2016-04-29 NOTE — ED Triage Notes (Signed)
Pt reports onset of L eye edema and redness this am.
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
## 2016-12-26 ENCOUNTER — Encounter (HOSPITAL_COMMUNITY): Payer: Self-pay

## 2016-12-26 ENCOUNTER — Emergency Department (HOSPITAL_COMMUNITY)
Admission: EM | Admit: 2016-12-26 | Discharge: 2016-12-26 | Disposition: A | Discharge status: Home / Self Care | Payer: Medicaid Other | Attending: Emergency Medicine | Admitting: Emergency Medicine

## 2016-12-26 DIAGNOSIS — R0602 Shortness of breath: Secondary | ICD-10-CM

## 2016-12-26 DIAGNOSIS — F17220 Nicotine dependence, chewing tobacco, uncomplicated: Secondary | ICD-10-CM | POA: Insufficient documentation

## 2016-12-26 DIAGNOSIS — J45901 Unspecified asthma with (acute) exacerbation: Secondary | ICD-10-CM | POA: Insufficient documentation

## 2016-12-26 DIAGNOSIS — Z79899 Other long term (current) drug therapy: Secondary | ICD-10-CM | POA: Insufficient documentation

## 2016-12-26 MED ORDER — PREDNISONE 20 MG PO TABS
40.0000 mg | ORAL_TABLET | Freq: Once | ORAL | Status: AC
  Administered 2016-12-26: 40 mg via ORAL
  Filled 2016-12-26: qty 2

## 2016-12-26 MED ORDER — IPRATROPIUM-ALBUTEROL 0.5-2.5 (3) MG/3ML IN SOLN
3.0000 mL | RESPIRATORY_TRACT | Status: AC
  Administered 2016-12-26 (×3): 3 mL via RESPIRATORY_TRACT
  Filled 2016-12-26 (×3): qty 3

## 2016-12-26 MED ORDER — ALBUTEROL SULFATE HFA 108 (90 BASE) MCG/ACT IN AERS
2.0000 | INHALATION_SPRAY | Freq: Once | RESPIRATORY_TRACT | Status: AC
  Administered 2016-12-26: 2 via RESPIRATORY_TRACT
  Filled 2016-12-26: qty 6.7

## 2016-12-26 MED ORDER — PREDNISONE 20 MG PO TABS: ORAL_TABLET | ORAL | 0 refills | Status: DC

## 2016-12-26 MED ORDER — IPRATROPIUM-ALBUTEROL 0.5-2.5 (3) MG/3ML IN SOLN: 3.0000 mL | RESPIRATORY_TRACT | Status: DC

## 2016-12-26 NOTE — ED Triage Notes (Signed)
Pt reports has had sob for the past 2 weeks and it got worse after mowing the lawn yesterday.  Reports wheezing and chest tightness.  Pt has history of asthma and is supposed to use an inhaler but says he doesn't have one.

## 2016-12-26 NOTE — ED Notes (Signed)
resp tx notified of orders
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
## 2017-04-25 ENCOUNTER — Emergency Department (HOSPITAL_COMMUNITY)
Admission: EM | Admit: 2017-04-25 | Discharge: 2017-04-25 | Disposition: A | Discharge status: Home / Self Care | Payer: Self-pay | Attending: Emergency Medicine | Admitting: Emergency Medicine

## 2017-04-25 ENCOUNTER — Encounter (HOSPITAL_COMMUNITY): Payer: Self-pay | Admitting: Emergency Medicine

## 2017-04-25 DIAGNOSIS — J45909 Unspecified asthma, uncomplicated: Secondary | ICD-10-CM | POA: Insufficient documentation

## 2017-04-25 DIAGNOSIS — R1032 Left lower quadrant pain: Secondary | ICD-10-CM | POA: Insufficient documentation

## 2017-04-25 DIAGNOSIS — R109 Unspecified abdominal pain: Secondary | ICD-10-CM

## 2017-04-25 DIAGNOSIS — F17220 Nicotine dependence, chewing tobacco, uncomplicated: Secondary | ICD-10-CM | POA: Insufficient documentation

## 2017-04-25 LAB — URINALYSIS, ROUTINE W REFLEX MICROSCOPIC
Bilirubin Urine: NEGATIVE
GLUCOSE, UA: NEGATIVE mg/dL
HGB URINE DIPSTICK: NEGATIVE
KETONES UR: NEGATIVE mg/dL
LEUKOCYTES UA: NEGATIVE
Nitrite: NEGATIVE
Protein, ur: NEGATIVE mg/dL
Specific Gravity, Urine: 1.012 (ref 1.005–1.030)
pH: 7 (ref 5.0–8.0)

## 2017-04-25 MED ORDER — VALACYCLOVIR HCL 500 MG PO TABS
1000.0000 mg | ORAL_TABLET | ORAL | Status: AC
  Administered 2017-04-25: 1000 mg via ORAL
  Filled 2017-04-25: qty 2

## 2017-04-25 MED ORDER — VALACYCLOVIR HCL 1 G PO TABS: 1000.0000 mg | ORAL_TABLET | Freq: Three times a day (TID) | ORAL | 0 refills | Status: AC

## 2017-04-25 MED ORDER — VALACYCLOVIR HCL 500 MG PO TABS: 500.0000 mg | ORAL_TABLET | ORAL | Status: DC

## 2017-04-25 MED ORDER — IBUPROFEN 800 MG PO TABS: 800.0000 mg | ORAL_TABLET | Freq: Three times a day (TID) | ORAL | 0 refills | Status: AC

## 2017-04-25 NOTE — Discharge Instructions (Signed)
Your urine sample has shown no signs of infection, no signs of kidney stone or bleeding.  If you should develop severe or worsening pain, fever or vomiting return to the emergency department.  Please take Valtrex, 1 g by mouth 3 times a day for 7 days in case this is an early shingles infection. Please read the attached instructions about possible shingles

## 2017-04-25 NOTE — ED Notes (Signed)
C/o  Left side pain worse with touch.
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
## 2017-04-25 NOTE — ED Triage Notes (Signed)
Patient c/o left lower abd pain that started on Friday. Denies any nausea, vomiting, diarrhea, fevers, or urinary symptoms. Patient does state that it originally started in left mid/lower back. Denies hx of kidney stones.

## 2018-02-06 ENCOUNTER — Emergency Department (HOSPITAL_COMMUNITY): Payer: Self-pay

## 2018-02-06 ENCOUNTER — Encounter (HOSPITAL_COMMUNITY): Payer: Self-pay | Admitting: Emergency Medicine

## 2018-02-06 ENCOUNTER — Emergency Department (HOSPITAL_COMMUNITY)
Admission: EM | Admit: 2018-02-06 | Discharge: 2018-02-06 | Disposition: A | Discharge status: Home / Self Care | Payer: Self-pay | Attending: Emergency Medicine | Admitting: Emergency Medicine

## 2018-02-06 ENCOUNTER — Other Ambulatory Visit: Payer: Self-pay

## 2018-02-06 DIAGNOSIS — M25512 Pain in left shoulder: Secondary | ICD-10-CM | POA: Insufficient documentation

## 2018-02-06 DIAGNOSIS — J45909 Unspecified asthma, uncomplicated: Secondary | ICD-10-CM | POA: Insufficient documentation

## 2018-02-06 DIAGNOSIS — F17220 Nicotine dependence, chewing tobacco, uncomplicated: Secondary | ICD-10-CM | POA: Insufficient documentation

## 2018-02-06 NOTE — ED Triage Notes (Signed)
Patient c/o left shoulder/axillary pain. Per patient injured shoulder while trying to slide on stomach down a slip and slide. Limited ROM. No obvious deformity or dislocation noted. Radial pulse present. CNS intact.
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
## 2018-02-06 NOTE — Discharge Instructions (Signed)
You may alternate taking Tylenol and Ibuprofen as needed for pain control. You may take 400-600 mg of ibuprofen every 6 hours and [phone] mg of Tylenol every 6 hours. Do not exceed 4000 mg of Tylenol daily as this can lead to liver damage. Also, make sure to take Ibuprofen with meals as it can cause an upset stomach. Do not take other NSAIDs while taking Ibuprofen such as (Aleve, Naprosyn, Aspirin, Celebrex, etc) and do not take more than the prescribed dose as this can lead to ulcers and bleeding in your GI tract. You may use warm and cold compresses to help with your symptoms.   Please follow up with your primary doctor within the next 7-10 days for re-evaluation and further treatment of your symptoms.   You were given a referral to the orthopedic doctor.  You may make an appointment for follow-up if you continue to have pain.  Please return to the ER sooner if you have any new or worsening symptoms.

## 2020-01-09 IMAGING — DX DG SHOULDER 2+V*L*
3 series · 3 of 3 positions shown · non-contrast
Comparison: None.

CLINICAL DATA: Left shoulder pain after injury.

EXAM:
LEFT SHOULDER - 2+ VIEW

[shoulder grashey]
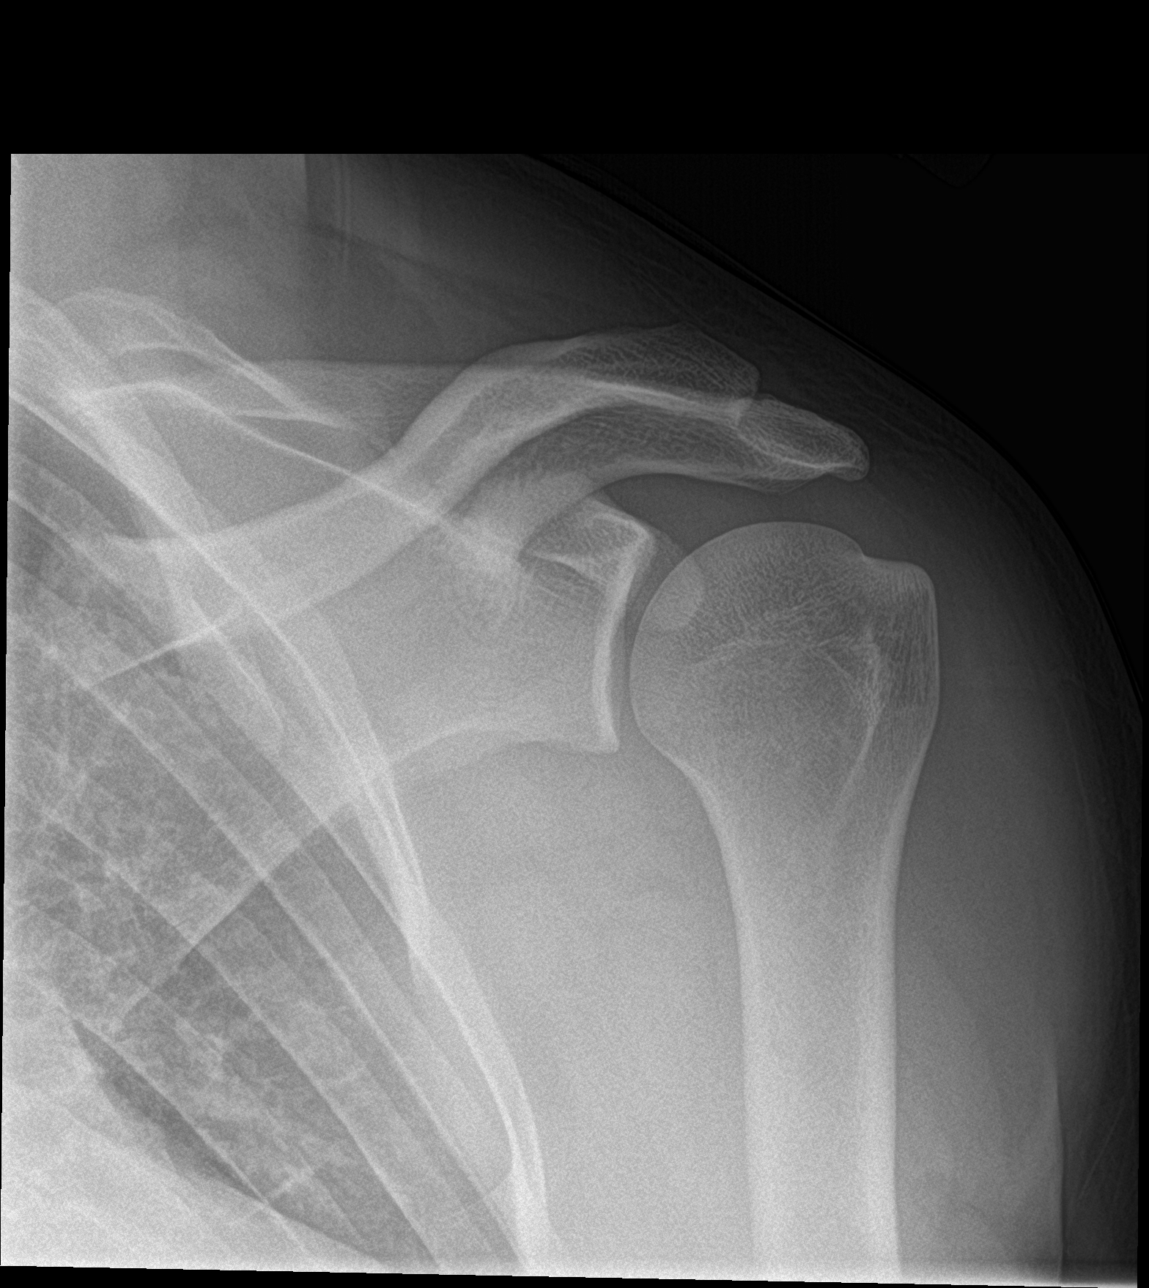

[shoulder axillary]
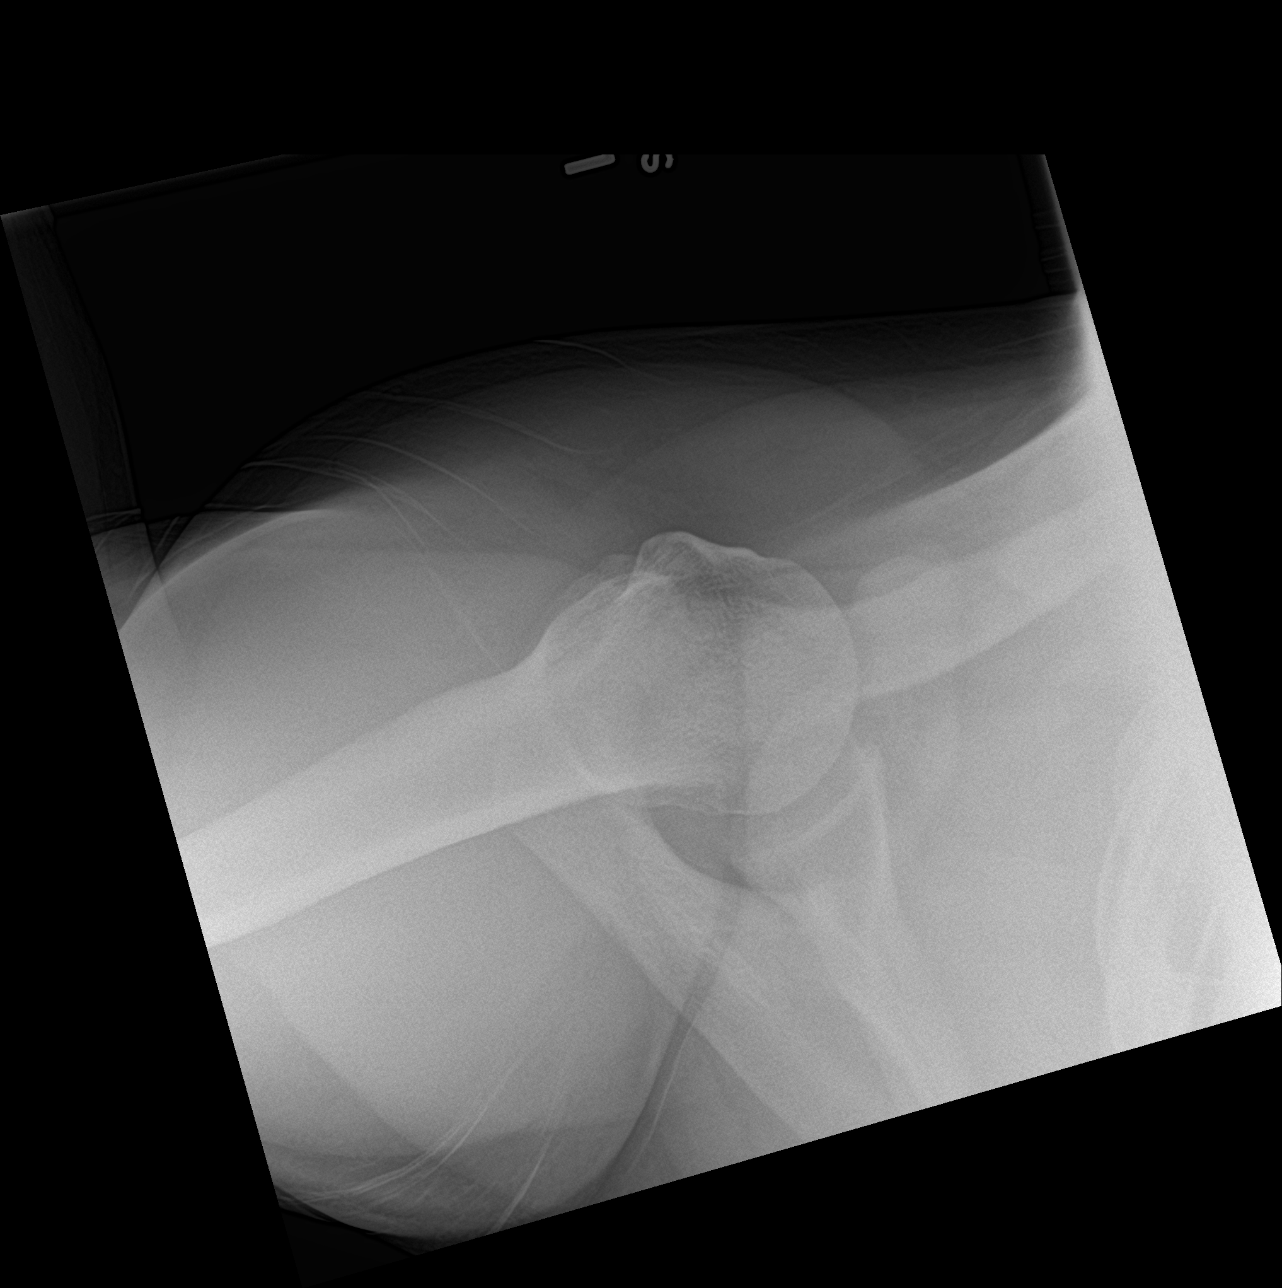

[shoulder y view]
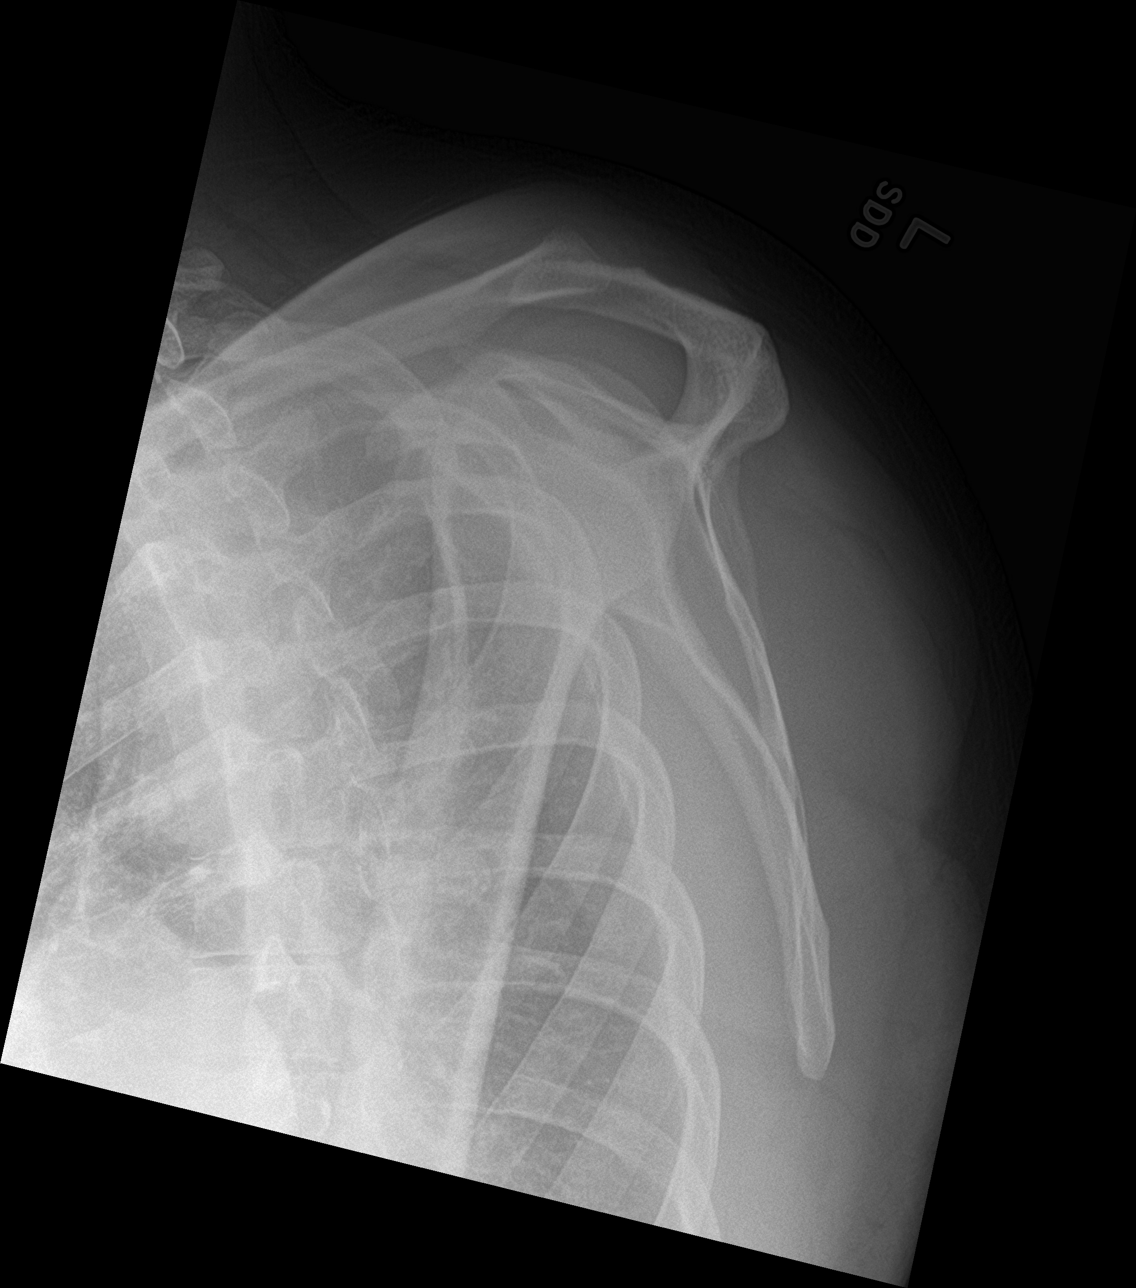

[3 of 3 positions shown; findings below may reference images not displayed]

FINDINGS: There is no evidence of fracture or dislocation. There is no
evidence of arthropathy or other focal bone abnormality. Soft
tissues are unremarkable.
IMPRESSION: Normal left shoulder.

## 2020-05-13 ENCOUNTER — Encounter: Payer: Self-pay | Admitting: Neurology
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
## 2020-07-08 ENCOUNTER — Ambulatory Visit: Payer: Self-pay | Admitting: Neurology

## 2020-07-08 ENCOUNTER — Encounter: Payer: Self-pay | Admitting: Neurology

## 2020-07-08 DIAGNOSIS — Z029 Encounter for administrative examinations, unspecified: Secondary | ICD-10-CM

## 2021-12-06 ENCOUNTER — Encounter (HOSPITAL_BASED_OUTPATIENT_CLINIC_OR_DEPARTMENT_OTHER): Payer: Self-pay

## 2021-12-06 DIAGNOSIS — G4710 Hypersomnia, unspecified: Secondary | ICD-10-CM

## 2021-12-06 DIAGNOSIS — R0683 Snoring: Secondary | ICD-10-CM

## 2021-12-06 DIAGNOSIS — R5383 Other fatigue: Secondary | ICD-10-CM

## 2021-12-06 DIAGNOSIS — G4733 Obstructive sleep apnea (adult) (pediatric): Secondary | ICD-10-CM
